# Patient Record
Sex: Female | Born: 1945 | Race: Black or African American | Hispanic: No | Smoking: Former smoker
Health system: Southern US, Community
[De-identification: ages and names within clinical notes are randomized; demographics above are authoritative.]

## PROBLEM LIST (undated history)

## (undated) DIAGNOSIS — E119 Type 2 diabetes mellitus without complications: Secondary | ICD-10-CM

## (undated) DIAGNOSIS — F0390 Unspecified dementia without behavioral disturbance: Secondary | ICD-10-CM

## (undated) DIAGNOSIS — J40 Bronchitis, not specified as acute or chronic: Secondary | ICD-10-CM

## (undated) DIAGNOSIS — G4730 Sleep apnea, unspecified: Secondary | ICD-10-CM

## (undated) DIAGNOSIS — Z951 Presence of aortocoronary bypass graft: Secondary | ICD-10-CM

## (undated) DIAGNOSIS — J449 Chronic obstructive pulmonary disease, unspecified: Secondary | ICD-10-CM

## (undated) HISTORY — PX: PACEMAKER INSERTION: SHX728

## (undated) HISTORY — PX: CORONARY ARTERY BYPASS GRAFT: SHX141

---

## 2015-07-31 ENCOUNTER — Emergency Department (HOSPITAL_COMMUNITY): Payer: Medicare Other

## 2015-07-31 ENCOUNTER — Observation Stay (HOSPITAL_COMMUNITY)
Admission: EM | Admit: 2015-07-31 | Discharge: 2015-08-01 | Disposition: A | Discharge status: Home / Self Care | Payer: Medicare Other | Attending: Internal Medicine | Admitting: Internal Medicine

## 2015-07-31 ENCOUNTER — Encounter (HOSPITAL_COMMUNITY): Payer: Self-pay | Admitting: Emergency Medicine

## 2015-07-31 DIAGNOSIS — R911 Solitary pulmonary nodule: Secondary | ICD-10-CM

## 2015-07-31 DIAGNOSIS — Z950 Presence of cardiac pacemaker: Secondary | ICD-10-CM | POA: Insufficient documentation

## 2015-07-31 DIAGNOSIS — Z794 Long term (current) use of insulin: Secondary | ICD-10-CM | POA: Diagnosis not present

## 2015-07-31 DIAGNOSIS — F05 Delirium due to known physiological condition: Secondary | ICD-10-CM | POA: Diagnosis not present

## 2015-07-31 DIAGNOSIS — R918 Other nonspecific abnormal finding of lung field: Secondary | ICD-10-CM

## 2015-07-31 DIAGNOSIS — N179 Acute kidney failure, unspecified: Secondary | ICD-10-CM | POA: Diagnosis not present

## 2015-07-31 DIAGNOSIS — J9621 Acute and chronic respiratory failure with hypoxia: Secondary | ICD-10-CM | POA: Diagnosis not present

## 2015-07-31 DIAGNOSIS — E119 Type 2 diabetes mellitus without complications: Secondary | ICD-10-CM

## 2015-07-31 DIAGNOSIS — G4730 Sleep apnea, unspecified: Secondary | ICD-10-CM | POA: Diagnosis not present

## 2015-07-31 DIAGNOSIS — Z79899 Other long term (current) drug therapy: Secondary | ICD-10-CM | POA: Diagnosis not present

## 2015-07-31 DIAGNOSIS — J189 Pneumonia, unspecified organism: Secondary | ICD-10-CM | POA: Diagnosis not present

## 2015-07-31 DIAGNOSIS — J441 Chronic obstructive pulmonary disease with (acute) exacerbation: Secondary | ICD-10-CM | POA: Diagnosis present

## 2015-07-31 DIAGNOSIS — Z951 Presence of aortocoronary bypass graft: Secondary | ICD-10-CM | POA: Diagnosis not present

## 2015-07-31 DIAGNOSIS — Z7982 Long term (current) use of aspirin: Secondary | ICD-10-CM | POA: Insufficient documentation

## 2015-07-31 DIAGNOSIS — F0390 Unspecified dementia without behavioral disturbance: Secondary | ICD-10-CM | POA: Diagnosis not present

## 2015-07-31 DIAGNOSIS — R05 Cough: Secondary | ICD-10-CM | POA: Diagnosis present

## 2015-07-31 DIAGNOSIS — Z87891 Personal history of nicotine dependence: Secondary | ICD-10-CM | POA: Insufficient documentation

## 2015-07-31 DIAGNOSIS — N289 Disorder of kidney and ureter, unspecified: Secondary | ICD-10-CM

## 2015-07-31 DIAGNOSIS — I25810 Atherosclerosis of coronary artery bypass graft(s) without angina pectoris: Secondary | ICD-10-CM | POA: Diagnosis not present

## 2015-07-31 HISTORY — DX: Unspecified dementia, unspecified severity, without behavioral disturbance, psychotic disturbance, mood disturbance, and anxiety: F03.90

## 2015-07-31 HISTORY — DX: Presence of aortocoronary bypass graft: Z95.1

## 2015-07-31 HISTORY — DX: Sleep apnea, unspecified: G47.30

## 2015-07-31 HISTORY — DX: Bronchitis, not specified as acute or chronic: J40

## 2015-07-31 HISTORY — DX: Chronic obstructive pulmonary disease, unspecified: J44.9

## 2015-07-31 HISTORY — DX: Type 2 diabetes mellitus without complications: E11.9

## 2015-07-31 LAB — CBC
HCT: 38.3 % (ref 36.0–46.0)
HEMOGLOBIN: 12.4 g/dL (ref 12.0–15.0)
MCH: 29.9 pg (ref 26.0–34.0)
MCHC: 32.4 g/dL (ref 30.0–36.0)
MCV: 92.3 fL (ref 78.0–100.0)
Platelets: 166 10*3/uL (ref 150–400)
RBC: 4.15 MIL/uL (ref 3.87–5.11)
RDW: 12.3 % (ref 11.5–15.5)
WBC: 5.6 10*3/uL (ref 4.0–10.5)

## 2015-07-31 LAB — BASIC METABOLIC PANEL
ANION GAP: 7 (ref 5–15)
BUN: 19 mg/dL (ref 6–20)
CHLORIDE: 108 mmol/L (ref 101–111)
CO2: 25 mmol/L (ref 22–32)
CREATININE: 1.62 mg/dL — AB (ref 0.44–1.00)
Calcium: 9.6 mg/dL (ref 8.9–10.3)
GFR calc non Af Amer: 31 mL/min — ABNORMAL LOW (ref >60)
GFR, EST AFRICAN AMERICAN: 36 mL/min — AB (ref >60)
Glucose, Bld: 90 mg/dL (ref 65–99)
Potassium: 4 mmol/L (ref 3.5–5.1)
Sodium: 140 mmol/L (ref 135–145)

## 2015-07-31 LAB — I-STAT TROPONIN, ED: Troponin i, poc: 0.01 ng/mL (ref 0.00–0.08)

## 2015-07-31 LAB — D-DIMER, QUANTITATIVE: D-Dimer, Quant: 0.7 ug/mL-FEU — ABNORMAL HIGH (ref 0.00–0.50)

## 2015-07-31 LAB — I-STAT CG4 LACTIC ACID, ED: LACTIC ACID, VENOUS: 0.83 mmol/L (ref 0.5–1.9)

## 2015-07-31 LAB — GLUCOSE, CAPILLARY: GLUCOSE-CAPILLARY: 119 mg/dL — AB (ref 65–99)

## 2015-07-31 MED ORDER — LEVOFLOXACIN 750 MG PO TABS: 750.0000 mg | ORAL_TABLET | ORAL | Status: DC

## 2015-07-31 MED ORDER — IPRATROPIUM-ALBUTEROL 0.5-2.5 (3) MG/3ML IN SOLN
3.0000 mL | RESPIRATORY_TRACT | Status: DC
  Administered 2015-07-31: 3 mL via RESPIRATORY_TRACT
  Filled 2015-07-31: qty 3

## 2015-07-31 MED ORDER — INSULIN GLARGINE 100 UNIT/ML ~~LOC~~ SOLN
10.0000 [IU] | Freq: Every day | SUBCUTANEOUS | Status: DC
  Administered 2015-07-31: 10 [IU] via SUBCUTANEOUS
  Filled 2015-07-31: qty 0.1

## 2015-07-31 MED ORDER — ROSUVASTATIN CALCIUM 40 MG PO TABS
40.0000 mg | ORAL_TABLET | Freq: Every day | ORAL | Status: DC
  Administered 2015-07-31: 40 mg via ORAL
  Filled 2015-07-31: qty 1

## 2015-07-31 MED ORDER — TRAZODONE HCL 100 MG PO TABS
300.0000 mg | ORAL_TABLET | Freq: Every day | ORAL | Status: DC
  Administered 2015-07-31: 300 mg via ORAL
  Filled 2015-07-31: qty 3

## 2015-07-31 MED ORDER — ENOXAPARIN SODIUM 30 MG/0.3ML ~~LOC~~ SOLN
30.0000 mg | SUBCUTANEOUS | Status: DC
  Administered 2015-07-31: 30 mg via SUBCUTANEOUS
  Filled 2015-07-31: qty 0.3

## 2015-07-31 MED ORDER — IPRATROPIUM-ALBUTEROL 0.5-2.5 (3) MG/3ML IN SOLN
3.0000 mL | Freq: Three times a day (TID) | RESPIRATORY_TRACT | Status: DC
  Administered 2015-08-01: 3 mL via RESPIRATORY_TRACT
  Filled 2015-07-31: qty 3

## 2015-07-31 MED ORDER — ALBUTEROL SULFATE (2.5 MG/3ML) 0.083% IN NEBU
5.0000 mg | INHALATION_SOLUTION | Freq: Once | RESPIRATORY_TRACT | Status: AC
  Administered 2015-07-31: 5 mg via RESPIRATORY_TRACT
  Filled 2015-07-31: qty 6

## 2015-07-31 MED ORDER — DONEPEZIL HCL 10 MG PO TABS
10.0000 mg | ORAL_TABLET | Freq: Every evening | ORAL | Status: DC
Start: 2015-07-31 — End: 2015-08-01
  Filled 2015-07-31: qty 1

## 2015-07-31 MED ORDER — GUAIFENESIN ER 600 MG PO TB12
600.0000 mg | ORAL_TABLET | Freq: Two times a day (BID) | ORAL | Status: DC
  Administered 2015-07-31 – 2015-08-01 (×2): 600 mg via ORAL
  Filled 2015-07-31 (×2): qty 1

## 2015-07-31 MED ORDER — SODIUM CHLORIDE 0.9 % IV SOLN
INTRAVENOUS | Status: DC
  Administered 2015-07-31: 21:00:00 via INTRAVENOUS

## 2015-07-31 MED ORDER — IOPAMIDOL (ISOVUE-370) INJECTION 76%
100.0000 mL | Freq: Once | INTRAVENOUS | Status: AC | PRN
  Administered 2015-07-31: 80 mL via INTRAVENOUS

## 2015-07-31 MED ORDER — QUETIAPINE FUMARATE 25 MG PO TABS
50.0000 mg | ORAL_TABLET | Freq: Every day | ORAL | Status: DC
  Administered 2015-08-01: 50 mg via ORAL
  Filled 2015-07-31 (×2): qty 2

## 2015-07-31 MED ORDER — ASPIRIN EC 81 MG PO TBEC
81.0000 mg | DELAYED_RELEASE_TABLET | Freq: Every day | ORAL | Status: DC
  Administered 2015-08-01: 81 mg via ORAL
  Filled 2015-07-31: qty 1

## 2015-07-31 MED ORDER — METHYLPREDNISOLONE SODIUM SUCC 125 MG IJ SOLR
60.0000 mg | Freq: Four times a day (QID) | INTRAMUSCULAR | Status: DC
Start: 2015-07-31 — End: 2015-08-01
  Administered 2015-07-31 – 2015-08-01 (×3): 60 mg via INTRAVENOUS
  Filled 2015-07-31 (×3): qty 2

## 2015-07-31 MED ORDER — ISOSORBIDE MONONITRATE ER 30 MG PO TB24
30.0000 mg | ORAL_TABLET | Freq: Every evening | ORAL | Status: DC
  Filled 2015-07-31: qty 1

## 2015-07-31 MED ORDER — CARVEDILOL 3.125 MG PO TABS
3.1250 mg | ORAL_TABLET | Freq: Two times a day (BID) | ORAL | Status: DC
  Administered 2015-08-01: 3.125 mg via ORAL
  Filled 2015-07-31: qty 1

## 2015-07-31 MED ORDER — INSULIN ASPART 100 UNIT/ML ~~LOC~~ SOLN
0.0000 [IU] | Freq: Three times a day (TID) | SUBCUTANEOUS | Status: DC
  Administered 2015-08-01: 2 [IU] via SUBCUTANEOUS

## 2015-07-31 MED ORDER — LEVOFLOXACIN IN D5W 750 MG/150ML IV SOLN
750.0000 mg | Freq: Once | INTRAVENOUS | Status: AC
  Administered 2015-07-31: 750 mg via INTRAVENOUS
  Filled 2015-07-31: qty 150

## 2015-07-31 MED ORDER — SODIUM CHLORIDE 0.9 % IV BOLUS (SEPSIS)
1000.0000 mL | Freq: Once | INTRAVENOUS | Status: AC
  Administered 2015-07-31: 1000 mL via INTRAVENOUS

## 2015-07-31 MED ORDER — ALBUTEROL SULFATE (2.5 MG/3ML) 0.083% IN NEBU
2.5000 mg | INHALATION_SOLUTION | Freq: Four times a day (QID) | RESPIRATORY_TRACT | Status: DC | PRN
  Administered 2015-08-01: 2.5 mg via RESPIRATORY_TRACT
  Filled 2015-07-31: qty 3

## 2015-07-31 MED ORDER — IPRATROPIUM-ALBUTEROL 0.5-2.5 (3) MG/3ML IN SOLN
3.0000 mL | Freq: Once | RESPIRATORY_TRACT | Status: AC
  Administered 2015-07-31: 3 mL via RESPIRATORY_TRACT
  Filled 2015-07-31: qty 3

## 2015-07-31 MED ORDER — LORATADINE 10 MG PO TABS: 10.0000 mg | ORAL_TABLET | Freq: Every day | ORAL | Status: DC

## 2015-07-31 MED ORDER — ENOXAPARIN SODIUM 40 MG/0.4ML ~~LOC~~ SOLN: 40.0000 mg | SUBCUTANEOUS | Status: DC

## 2015-07-31 NOTE — ED Triage Notes (Signed)
Patient states that she has been in [place] since June and family noticed cough but patient is unable to get up any phlegm but states that she can hear congestion in her chest when she coughs.  Patient states that she wears O2 Continuously at home and uses inhaler and neb treatment.  Patient came to Ed today without her O2.

## 2015-07-31 NOTE — ED Provider Notes (Signed)
[facility]-EMERGENCY DEPT Provider Note   CSN: [id number] Arrival date & time: 07/31/15  1328  First Provider Contact:  First MD Initiated Contact with Patient 07/31/15 1532     History   Chief Complaint Chief Complaint  Patient presents with  . Cough  . Shortness of Breath   HPI   Susan Shea is an 70 y.o. female with history of CAD, COPD, DM who presents to the ED for evaluation of shortness of breath. She states for the past three days she has felt more short of breath than usual. She states she does usually require 2L O2 via [place] at baseline and Saturday she was out for six hours and forgot to bring her O2 tank with her. Since then she has felt more short of breath than usual even with her home oxygen. She states she has had to work harder to breathe and has particularly noticed increased dyspnea on exertion. She has tried nebulizers at home with no relief. She does not think she is on chronic ICS. She denies chest pain. Denies new leg pain or swelling. She does note she recently traveled to the area from [place] to visit family. She endorses smoking cigarettes but quit "years ago." endorses cough increased from baseline. She states she has been taking all of her home meds as prescribed. Denies fever, chills, abd pain, n/v/d.   On arrival to the ED pt satting mid 80s on 2L [place] (her baseline oxygen requirement). No tachypnea.   Past Medical History:  Diagnosis Date  . Bronchitis   . COPD (chronic obstructive pulmonary disease) (HCC)   . Diabetes mellitus without complication (HCC)   . Hx of CABG     There are no active problems to display for this patient.   Past Surgical History:  Procedure Laterality Date  . CORONARY ARTERY BYPASS GRAFT    . PACEMAKER INSERTION      OB History    No data available       Home Medications    Prior to Admission medications   Medication Sig Start Date End Date Taking? Authorizing Provider  aspirin EC 81 MG tablet Take 81 mg by mouth  daily.   Yes Historical Provider, MD  carvedilol (COREG) 3.125 MG tablet Take 3.125 mg by mouth 2 (two) times daily with a meal.   Yes Historical Provider, MD  cetirizine (ZYRTEC) 10 MG tablet Take 10 mg by mouth at bedtime. 07/12/15  Yes Historical Provider, MD  donepezil (ARICEPT) 10 MG tablet Take 10 mg by mouth every evening.   Yes Historical Provider, MD  furosemide (LASIX) 20 MG tablet Take 20 mg by mouth every other day.   Yes Historical Provider, MD  insulin glargine (LANTUS) 100 UNIT/ML injection Inject 10 Units into the skin at bedtime.   Yes Historical Provider, MD  insulin lispro (HUMALOG) 100 UNIT/ML injection Inject 2-4 Units into the skin 3 (three) times daily before meals. If blood sugar is 149-180 inject 2 units,180-200 3 units, 200 and above 4 units   Yes Historical Provider, MD  isosorbide mononitrate (IMDUR) 30 MG 24 hr tablet Take 30 mg by mouth every evening.   Yes Historical Provider, MD  QUEtiapine (SEROQUEL) 100 MG tablet Take 50 mg by mouth daily.   Yes Historical Provider, MD  rosuvastatin (CRESTOR) 40 MG tablet Take 40 mg by mouth at bedtime.   Yes Historical Provider, MD  traZODone (DESYREL) 100 MG tablet Take 300 mg by mouth at bedtime.   Yes Historical  Provider, MD  triamcinolone cream (KENALOG) 0.1 % Apply 1 application topically 2 (two) times daily as needed (itch).  07/12/15  Yes Historical Provider, MD    Family History No family history on file.  Social History Social History  Substance Use Topics  . Smoking status: Former Games developer  . Smokeless tobacco: Never Used  . Alcohol use No    Allergies   Ampicillin and Penicillins   Review of Systems Review of Systems 10 Systems reviewed and are negative for acute change except as noted in the HPI.   Physical Exam Updated Vital Signs BP 109/66 (BP Location: Left Arm)   Pulse 76   Temp 98.4 F (36.9 C) (Oral)   Resp 19   Ht  (1.6 m)   SpO2 (!) 86%   Physical Exam  Constitutional: She is oriented  to person, place, and time. No distress.  HENT:  Right Ear: External ear normal.  Left Ear: External ear normal.  Nose: Nose normal.  Mouth/Throat: Oropharynx is clear and moist. No oropharyngeal exudate.  Eyes: Conjunctivae and EOM are normal. Pupils are equal, round, and reactive to light.  Neck: Normal range of motion. Neck supple.  Cardiovascular: Normal rate, regular rhythm and normal heart sounds.   Pulmonary/Chest: Effort normal. No respiratory distress. She exhibits no tenderness.  I evaluated pt after initial albuterol neb tx Bilateral rhonchi and coarse breath sounds, some improvement with coughing Minimal bilateral expiratory wheezing in bases No increased WOB No tachypnea  Abdominal: Soft. Bowel sounds are normal. She exhibits no distension. There is no tenderness. There is no rebound and no guarding.  Musculoskeletal: She exhibits no edema.  No LE edema No calf tenderness  Lymphadenopathy:    She has no cervical adenopathy.  Neurological: She is alert and oriented to person, place, and time. No cranial nerve deficit.  Skin: Skin is warm and dry. She is not diaphoretic.  Psychiatric: She has a normal mood and affect.  Nursing note and vitals reviewed.  Vitals:   07/31/15 1548 07/31/15 1615 07/31/15 1642 07/31/15 1740  BP:  96/71 102/62 (!) 107/50  Pulse: 63 (!) 54 (!) 51 (!) 51  Resp: Temp:    97.6 F (36.4 C)  TempSrc:    Oral  SpO2: 96% 94% 94% 93%  Height:         ED Treatments / Results  Labs (all labs ordered are listed, but only abnormal results are displayed) Labs Reviewed  BASIC METABOLIC PANEL - Abnormal; Notable for the following:       Result Value   Creatinine, Ser 1.62 (*)    GFR calc non Af Amer 31 (*)    GFR calc Af Amer 36 (*)    All other components within normal limits  D-DIMER, QUANTITATIVE (NOT AT [facility]) - Abnormal; Notable for the following:    D-Dimer, Quant 0.70 (*)    All other components within normal limits  CBC    I-STAT TROPOININ, ED  I-STAT CG4 LACTIC ACID, ED    Radiology Dg Chest 2 View  Result Date: 07/31/2015 CLINICAL DATA:  Shortness of breath for 2 days with dry cough. EXAM: CHEST  2 VIEW COMPARISON:  None. FINDINGS: The heart size is normal. AICD is in place. Posterior medial right lower lobe airspace disease is present. The upper lung fields are clear. Emphysema is noted. The patient is status post median sternotomy for CABG. No atherosclerotic calcifications are present throughout a tortuous aorta. There is  some exaggeration of thoracic kyphosis. Vertebral body heights and alignment are maintained. IMPRESSION: 1. Posterior medial right lower lobe pneumonia. 2. Additional bibasilar atelectasis is present bilaterally. 3. Emphysema. 4. Atherosclerosis of the thoracic aorta. Electronically Signed   By: Marin Roberts M.D.   On: 07/31/2015 16:24  Ct Angio Chest Pe W/cm &/or Wo Cm  Result Date: 07/31/2015 CLINICAL DATA:  Cough and congestion since June. EXAM: CT ANGIOGRAPHY CHEST WITH CONTRAST TECHNIQUE: Multidetector CT imaging of the chest was performed using the standard protocol during bolus administration of intravenous contrast. Multiplanar CT image reconstructions and MIPs were obtained to evaluate the vascular anatomy. CONTRAST:  80 cc Isovue 370 COMPARISON:  Chest x-ray 07/31/2015 FINDINGS: Cardiovascular: The heart is normal in size for age. No pericardial effusion. There is moderate tortuosity, ectasia and calcification of the thoracic aorta. No focal aneurysm. Extensive coronary artery calcifications. Surgical changes from bypass surgery. Permanent right ventricular pacer wires/AICD. The pulmonary arterial tree is fairly well opacified. No filling defects to suggest pulmonary embolism. Mediastinum/Nodes: No mediastinal or hilar mass or adenopathy. Small scattered lymph nodes are noted. The esophagus is grossly normal. Lungs/Pleura: Advanced emphysematous changes and pulmonary scarring.  Right middle lobe airspace opacity and loss of volume likely chronic infiltrate and atelectasis. There are also bibasilar scarring changes and areas of probable subpleural atelectasis. The left apical lung lesion measuring 12 mm is worrisome for neoplasm. PET-CT suggested for further evaluation. Upper Abdomen: No significant upper abdominal findings. Reflux of contrast material down the IVC likely due to tricuspid regurgitation. Musculoskeletal: No significant bony findings. No breast masses, supraclavicular or axillary adenopathy. Review of the MIP images confirms the above findings. IMPRESSION: 1. No CT findings for acute pulmonary embolism. 2. Surgical changes from bypass surgery with dense 3 vessel coronary artery calcifications. 3. Moderate tortuosity, ectasia and calcification of the thoracic aorta. 4. 12 x 10 mm left apical lung lesion worrisome for neoplasm. Recommend PET-CT for further evaluation. 5. Advanced emphysematous changes. 6. Right middle lobe infiltrate and atelectasis. Electronically Signed   By: Rudie Meyer M.D.   On: 07/31/2015 17:42   Procedures Procedures (including critical care time)  ED ECG REPORT   Date: 07/31/2015  Rate: 55  Rhythm: normal sinus rhythm  QRS Axis: normal  Intervals: QT prolonged  ST/T Wave abnormalities: nonspecific ST changes  Conduction Disutrbances:none  Narrative Interpretation:   Old EKG Reviewed: none available  I have personally reviewed the EKG tracing and agree with the computerized printout as noted.   Medications Ordered in ED Medications  levofloxacin (LEVAQUIN) IVPB 750 mg (750 mg Intravenous New Bag/Given 07/31/15 1825)  albuterol (PROVENTIL) (2.5 MG/3ML) 0.083% nebulizer solution 5 mg (5 mg Nebulization Given 07/31/15 1417)  ipratropium-albuterol (DUONEB) 0.5-2.5 (3) MG/3ML nebulizer solution 3 mL (3 mLs Nebulization Given 07/31/15 1615)  iopamidol (ISOVUE-370) 76 % injection 100 mL (80 mLs Intravenous Contrast Given 07/31/15 1714)   sodium chloride 0.9 % bolus 1,000 mL (1,000 mLs Intravenous New Bag/Given 07/31/15 1824)     Initial Impression / Assessment and Plan / ED Course  I have reviewed the triage vital signs and the nursing notes.  Pertinent labs & imaging results that were available during my care of the patient were reviewed by me and considered in my medical decision making (see chart for details).  Pt is an 70 y.o. female with known COPD visiting from [place] here with two days of increased SOB from baseline. Her oxygen requirement is increased from baseline. She feels better after albuterol  neb x 1 and duoneb x1. She is still fluctuating down to the low 90s high 80s with her baseline 2L O2 via [place]. Improves to mid 90s on 3L [place]. EKG nonactue. Trop negative. D-dimer was ordered due to shortness of breath and worsening hypoxia with recent travel though no LE edema or calf tenderness and unfortunately returned at 0.7. RLL pneumonia visualized on CXR. This is confirmed on CT angio. CT angio negative for PE. Unfortunately does show a 10mm left apical lesion worrisome for neoplasm with recommendations for PET scan. Dr. Ethelda Chick discussed findings with pt and her family. We will admit to the hospitalist team for COPD exacerbation likely secondary to community acquired pneumonia. Will give dose of levaquin in the ED and start some fluids. Cr is 1.6 today, unsure of baseline.  Spoke with Dr. Montez Morita who will admit pt to medsurg. Appreciate assistance.  Final Clinical Impressions(s) / ED Diagnoses   Final diagnoses:  COPD exacerbation (HCC)  CAP (community acquired pneumonia)  Acute on chronic respiratory failure with hypoxia (HCC)  Renal insufficiency    New Prescriptions New Prescriptions   No medications on file     Derl Barrow 07/31/15 1936 Medical screening examination/treatment/procedure(s) were conducted as a shared visit with non-physician practitioner(s) and myself.  I personally  evaluated the patient during the encounter.   EKG Interpretation None        Doug Sou, MD 08/01/15 4098

## 2015-07-31 NOTE — H&P (Signed)
History and Physical    Susan Shea ZHY:[id number] DOB: 22-Apr-1945 DOA: 07/31/2015  PCP: The patient is visiting from the [place] area  Patient coming from: Home  Chief Complaint: Progressive cough with sputum production, DOE  HPI: Susan Shea is a 70 y.o. woman with a history of COPD on 2L [place] at baseline, CAD S/P 1v CABG, DM, and sleep apnea (noncompliant with CPAP at baseline) who presents to the ED accompanied by her daughter for evaluation of progressive shortness of breath at rest and DOE for the past several days.  She admits that she has had a cough for several weeks (since June), but she has started to produce thick, white phlegm.  She denies hemoptysis.  No known sick contacts.  She denies chest pain, pressure, or tightness ("except around my pacemaker").  Some light-headedness but dizziness or syncope.  No nausea and vomiting.  Appetite is normal.  No weight loss.  She has been using her rescue inhaler 6-7 times daily plus nebulizer treatments twice daily without relief.  Of note, her daughter reports that she was treated for pneumonia in March.  The patient has a recent diagnosis of dementia and has been "removed" from her home by her children.  She is now alternating living with each child for several months at a time.  She is primarily back and forth between her two children's homes in [place].  She has been in this area with her daughter since June, and this is the first time that she has spent this much time in [place] in many years.    ED Course: The patient was hypoxic with O2 sats in the mid 80's on 2L [place] (her baseline) upon arrival.  She improved rapidly with duoneb treatments in the ED.  Chest xray shows a RML pneumonia (daughter is not sure of the location of previous pneumonia).  Lactic acid level is normal.  She has received 2L of NS.  She has received IV levaquin.  It does not appear that blood cultures were drawn in the ED.    D-Dimer was mildly positive, so CTA of the  chest was checked to rule out PE.  No evidence of PE, but the patient has a 1cm left apical lung mass that will need follow-up.    She is now maintaining normal O2 sats on 2L.  Hospitalist asked to admit.    Review of Systems: As per HPI otherwise 10 point review of systems negative.    Past Medical History:  Diagnosis Date  . Bronchitis   . COPD (chronic obstructive pulmonary disease) (HCC)   . Dementia   . Diabetes mellitus without complication (HCC)   . Hx of CABG   . Sleep apnea    Noncompliant with CPAP    Past Surgical History:  Procedure Laterality Date  . CORONARY ARTERY BYPASS GRAFT    . PACEMAKER INSERTION       reports that she quit smoking about 2 years ago. Her smoking use included Cigarettes. She quit after 50.00 years of use. She has never used smokeless tobacco. She reports that she does not drink alcohol or use drugs.  She is a widow.  She has three adult children as noted above.    Allergies  Allergen Reactions  . Ampicillin Hives  . Penicillins Hives    Has patient had a PCN reaction causing immediate rash, facial/tongue/throat swelling, SOB or lightheadedness with hypotension: Yes Has patient had a PCN reaction causing severe rash involving mucus membranes or skin  necrosis: Yes Has patient had a PCN reaction that required hospitalization Yes Has patient had a PCN reaction occurring within the last 10 years: No If all of the above answers are "NO", then may proceed with Cephalosporin use.    FAMILY HISTORY: Not obtained.  Prior to Admission medications   Medication Sig Start Date End Date Taking? Authorizing Provider  aspirin EC 81 MG tablet Take 81 mg by mouth daily.   Yes Historical Provider, MD  carvedilol (COREG) 3.125 MG tablet Take 3.125 mg by mouth 2 (two) times daily with a meal.   Yes Historical Provider, MD  cetirizine (ZYRTEC) 10 MG tablet Take 10 mg by mouth at bedtime. 07/12/15  Yes Historical Provider, MD  donepezil (ARICEPT) 10 MG tablet  Take 10 mg by mouth every evening.   Yes Historical Provider, MD  furosemide (LASIX) 20 MG tablet Take 20 mg by mouth every other day.   Yes Historical Provider, MD  insulin glargine (LANTUS) 100 UNIT/ML injection Inject 10 Units into the skin at bedtime.   Yes Historical Provider, MD  insulin lispro (HUMALOG) 100 UNIT/ML injection Inject 2-4 Units into the skin 3 (three) times daily before meals. If blood sugar is 149-180 inject 2 units,180-200 3 units, 200 and above 4 units   Yes Historical Provider, MD  isosorbide mononitrate (IMDUR) 30 MG 24 hr tablet Take 30 mg by mouth every evening.   Yes Historical Provider, MD  QUEtiapine (SEROQUEL) 100 MG tablet Take 50 mg by mouth daily.   Yes Historical Provider, MD  rosuvastatin (CRESTOR) 40 MG tablet Take 40 mg by mouth at bedtime.   Yes Historical Provider, MD  traZODone (DESYREL) 100 MG tablet Take 300 mg by mouth at bedtime.   Yes Historical Provider, MD  triamcinolone cream (KENALOG) 0.1 % Apply 1 application topically 2 (two) times daily as needed (itch).  07/12/15  Yes Historical Provider, MD    Physical Exam: Vitals:   07/31/15 1740 07/31/15 1930 07/31/15 2007 07/31/15 2100  BP: (!) 107/50 128/78 136/66   Pulse: (!) 51 (!) 48 (!) 55   Resp: 17 19 20    Temp: 97.6 F (36.4 C)  98.5 F (36.9 C)   TempSrc: Oral  Oral   SpO2: 93% 99% 98%   Weight:    62.5 kg (137 lb 12.6 oz)  Height:    5\' 3"  (1.6 m)      Constitutional: NAD, calm, comfortable, NONtoxic appearing Vitals:   07/31/15 1740 07/31/15 1930 07/31/15 2007 07/31/15 2100  BP: (!) 107/50 128/78 136/66   Pulse: (!) 51 (!) 48 (!) 55   Resp: 17 19 20    Temp: 97.6 F (36.4 C)  98.5 F (36.9 C)   TempSrc: Oral  Oral   SpO2: 93% 99% 98%   Weight:    62.5 kg (137 lb 12.6 oz)  Height:    5\' 3"  (1.6 m)   Eyes: PERRL, lids and conjunctivae normal ENMT: Mucous membranes are moist. Posterior pharynx clear of any exudate or lesions. Neck: normal appearance, supple Respiratory: No  significant wheeze or ronchi for me at this time.  Normal respiratory effort. No accessory muscle use.  Cardiovascular: Normal rate, regular rhythm, no murmurs / rubs / gallops. No extremity edema. 2+ pedal pulses. GI: abdomen is soft and compressible.  No distention.  No tenderness.  No masses palpated.  Bowel sounds are present. Musculoskeletal:  No joint deformity in upper and lower extremities. Good ROM, no contractures. Normal muscle tone.  Skin: no  rashes, warm and dry Neurologic: No focal deficits. Psychiatric: Normal judgment and insight. Alert and oriented x 3. Normal mood.     Labs on Admission: I have personally reviewed following labs and imaging studies  CBC:  Recent Labs Lab 07/31/15 1445  WBC 5.6  HGB 12.4  HCT 38.3  MCV 92.3  PLT 166   Basic Metabolic Panel:  Recent Labs Lab 07/31/15 1445  NA 140  K 4.0  CL 108  CO2 25  GLUCOSE 90  BUN 19  CREATININE 1.62*  CALCIUM 9.6   Sepsis Labs:  Lactic acid 0.83  I-STAT troponin negative  Radiological Exams on Admission: Dg Chest 2 View  Result Date: 07/31/2015 CLINICAL DATA:  Shortness of breath for 2 days with dry cough. EXAM: CHEST  2 VIEW COMPARISON:  None. FINDINGS: The heart size is normal. AICD is in place. Posterior medial right lower lobe airspace disease is present. The upper lung fields are clear. Emphysema is noted. The patient is status post median sternotomy for CABG. No atherosclerotic calcifications are present throughout a tortuous aorta. There is some exaggeration of thoracic kyphosis. Vertebral body heights and alignment are maintained. IMPRESSION: 1. Posterior medial right lower lobe pneumonia. 2. Additional bibasilar atelectasis is present bilaterally. 3. Emphysema. 4. Atherosclerosis of the thoracic aorta. Electronically Signed   By: Marin Roberts M.D.   On: 07/31/2015 16:24  Ct Angio Chest Pe W/cm &/or Wo Cm  Result Date: 07/31/2015 CLINICAL DATA:  Cough and congestion since June.  EXAM: CT ANGIOGRAPHY CHEST WITH CONTRAST TECHNIQUE: Multidetector CT imaging of the chest was performed using the standard protocol during bolus administration of intravenous contrast. Multiplanar CT image reconstructions and MIPs were obtained to evaluate the vascular anatomy. CONTRAST:  80 cc Isovue 370 COMPARISON:  Chest x-ray 07/31/2015 FINDINGS: Cardiovascular: The heart is normal in size for age. No pericardial effusion. There is moderate tortuosity, ectasia and calcification of the thoracic aorta. No focal aneurysm. Extensive coronary artery calcifications. Surgical changes from bypass surgery. Permanent right ventricular pacer wires/AICD. The pulmonary arterial tree is fairly well opacified. No filling defects to suggest pulmonary embolism. Mediastinum/Nodes: No mediastinal or hilar mass or adenopathy. Small scattered lymph nodes are noted. The esophagus is grossly normal. Lungs/Pleura: Advanced emphysematous changes and pulmonary scarring. Right middle lobe airspace opacity and loss of volume likely chronic infiltrate and atelectasis. There are also bibasilar scarring changes and areas of probable subpleural atelectasis. The left apical lung lesion measuring 12 mm is worrisome for neoplasm. PET-CT suggested for further evaluation. Upper Abdomen: No significant upper abdominal findings. Reflux of contrast material down the IVC likely due to tricuspid regurgitation. Musculoskeletal: No significant bony findings. No breast masses, supraclavicular or axillary adenopathy. Review of the MIP images confirms the above findings. IMPRESSION: 1. No CT findings for acute pulmonary embolism. 2. Surgical changes from bypass surgery with dense 3 vessel coronary artery calcifications. 3. Moderate tortuosity, ectasia and calcification of the thoracic aorta. 4. 12 x 10 mm left apical lung lesion worrisome for neoplasm. Recommend PET-CT for further evaluation. 5. Advanced emphysematous changes. 6. Right middle lobe infiltrate  and atelectasis. Electronically Signed   By: Rudie Meyer M.D.   On: 07/31/2015 17:42   EKG: Independently reviewed. NSR.  No acute ST segment changes.  Assessment/Plan Principal Problem:   CAP (community acquired pneumonia) Active Problems:   COPD exacerbation (HCC)   Renal insufficiency   Acute on chronic respiratory failure with hypoxia (HCC)   Lung mass   Diabetes (HCC)  CAD (coronary artery disease) of artery bypass graft      COPD exacerbation secondary to RML pneumonia, CAP --IV levaquin --IV solumedrol 60mg  IV q6h, anticipate quick taper --Duonebs q4h while awake and PRN --Respiratory therapy, with Incentive spirometer and flutter valve use --Blood and sputum cultures --Urine legionella and streptococcal antigens  Lung mass --Will need outpatient follow-up.  Patient's daughter advised to discuss with her siblings in the [place] area and decide where the patient will be best served long-term.  Recommend initiating the work-up in the city where she will be long-term, for adequate follow-up.  If that is [place], then we can certainly assist with the needed referrals for appropriate diagnosis and treatment plans.  History of CAD --Continue baby aspirin, coreg, imdur, statin  Elevated creatinine on admission, baseline unclear, ?AKI --Hold lasix --hydrate --Repeat BMP in the AM  Dementia --Continue home doses of Aricept, seroquel, trazodone   DVT prophylaxis: Lovenox Code Status: FULL Family Communication: Daughter at bedside Disposition Plan: Expect she will go home at discharge Consults called: NONE Admission status: Observation, med surg   TIME SPENT: 55 minutes   Jerene Bears MD [facility] Pager [phone]  If 7PM-7AM, please contact night-coverage [email or web address] Password [facility]  07/31/2015, 9:12 PM

## 2015-07-31 NOTE — Progress Notes (Signed)
Pharmacy Antibiotic Note  Susan Shea is a 70 y.o. female admitted on 07/31/2015 with CAP and COPD exacerbation. Patient placed on Levofloxacin x 5 days per MD (ordered PO on admission). Pharmacy has been consulted for renally adjusting antibiotics.   Plan: Adjust Levofloxacin to 750mg  PO q48h to complete 5 days of therapy. Monitor QTc closely as patient is also on quetiapine and trazodone, which can also prolong QT interval. Note patient has borderline prolonged QT interval on admission. May need to consider using another agent if remains or becomes more prolonged.  Monitor renal function, cultures, clinical course.   Height: 5\' 3"  (160 cm) Weight: 137 lb 12.6 oz (62.5 kg) IBW/kg (Calculated) : 52.4  Temp (24hrs), Avg:98.2 F (36.8 C), Min:97.6 F (36.4 C), Max:98.5 F (36.9 C)   Recent Labs Lab 07/31/15 1445 07/31/15 1839  WBC 5.6  --   CREATININE 1.62*  --   LATICACIDVEN  --  0.83    Estimated Creatinine Clearance: 26.7 mL/min (by C-G formula based on SCr of 1.62 mg/dL).    Allergies  Allergen Reactions  . Ampicillin Hives  . Penicillins Hives    Has patient had a PCN reaction causing immediate rash, facial/tongue/throat swelling, SOB or lightheadedness with hypotension: Yes Has patient had a PCN reaction causing severe rash involving mucus membranes or skin necrosis: Yes Has patient had a PCN reaction that required hospitalization Yes Has patient had a PCN reaction occurring within the last 10 years: No If all of the above answers are "NO", then may proceed with Cephalosporin use.     Antimicrobials this admission: 7/24 >> Levofloxacin >> (7/28)  Dose adjustments this admission: 7/24: adjusted dosing from q24h to q48h for reduced renal function  Microbiology results: 7/24 BCx: sent (after antibiotics given in ED) 7/24 Sputum: sent  7/24 Strep pneumo urinary antigen: ordered 7/24 Legionella urinary antigen: ordered  Thank you for allowing pharmacy to be a part  of this patient's care.   Greer Pickerel, PharmD, BCPS Pager: [phone] 07/31/2015 9:36 PM

## 2015-07-31 NOTE — Progress Notes (Signed)
Patient listed as having Medicare insurance without a pcp.  EDCM spoke to patient at bedside.  Patient reports she lives in [place] and is here visiting.  Patient confirms she does not have a pcp.  [facility] provided patient with a list of pcps who accept Medicare insurance within a ten mile radius of patient's zip code [address].  Patient thankful for resources.  No further EDCM needs at this time.

## 2015-07-31 NOTE — ED Provider Notes (Signed)
Complains of cough with progressively worsening shortness of breath and cough for the past few weeks. Chest x-ray viewed by me   Doug Sou, MD 07/31/15 [phone]

## 2015-07-31 NOTE — ED Notes (Signed)
Unable to collect labs right now patient getting a breathing treatment

## 2015-08-01 ENCOUNTER — Ambulatory Visit: Payer: Medicare Other | Admitting: Sports Medicine

## 2015-08-01 ENCOUNTER — Telehealth: Payer: Self-pay | Admitting: Internal Medicine

## 2015-08-01 DIAGNOSIS — J189 Pneumonia, unspecified organism: Secondary | ICD-10-CM | POA: Diagnosis not present

## 2015-08-01 LAB — CBC
HEMATOCRIT: 34.6 % — AB (ref 36.0–46.0)
HEMOGLOBIN: 11.5 g/dL — AB (ref 12.0–15.0)
MCH: 29.8 pg (ref 26.0–34.0)
MCHC: 33.2 g/dL (ref 30.0–36.0)
MCV: 89.6 fL (ref 78.0–100.0)
Platelets: 156 10*3/uL (ref 150–400)
RBC: 3.86 MIL/uL — ABNORMAL LOW (ref 3.87–5.11)
RDW: 12.6 % (ref 11.5–15.5)
WBC: 4.3 10*3/uL (ref 4.0–10.5)

## 2015-08-01 LAB — BASIC METABOLIC PANEL
ANION GAP: 7 (ref 5–15)
BUN: 18 mg/dL (ref 6–20)
CALCIUM: 9.3 mg/dL (ref 8.9–10.3)
CHLORIDE: 112 mmol/L — AB (ref 101–111)
CO2: 21 mmol/L — AB (ref 22–32)
Creatinine, Ser: 1.09 mg/dL — ABNORMAL HIGH (ref 0.44–1.00)
GFR calc Af Amer: 58 mL/min — ABNORMAL LOW (ref >60)
GFR calc non Af Amer: 50 mL/min — ABNORMAL LOW (ref >60)
GLUCOSE: 123 mg/dL — AB (ref 65–99)
Potassium: 4.6 mmol/L (ref 3.5–5.1)
Sodium: 140 mmol/L (ref 135–145)

## 2015-08-01 LAB — GLUCOSE, CAPILLARY: GLUCOSE-CAPILLARY: 152 mg/dL — AB (ref 65–99)

## 2015-08-01 MED ORDER — METHYLPREDNISOLONE 4 MG PO TBPK: ORAL_TABLET | ORAL | 0 refills | Status: DC

## 2015-08-01 MED ORDER — LEVOFLOXACIN 750 MG PO TABS
750.0000 mg | ORAL_TABLET | ORAL | 0 refills | Status: DC
Start: 2015-08-02 — End: 2015-08-23

## 2015-08-01 MED ORDER — ENOXAPARIN SODIUM 40 MG/0.4ML ~~LOC~~ SOLN: 40.0000 mg | SUBCUTANEOUS | Status: DC

## 2015-08-01 NOTE — Discharge Summary (Addendum)
Susan Shea ZOX:[id number] DOB: Apr 13, 1945 DOA: 07/31/2015  PCP: No PCP Per Patient  Admit date: 07/31/2015  Discharge date: 08/01/2015  Admitted From: Home  Disposition:  Home   Recommendations for Outpatient Follow-up:   Follow up with PCP in 1-2 weeks  PCP Please obtain BMP/CBC, 2 view CXR in 1week,  (see Discharge instructions)   PCP Please follow up on the following pending results: None   Home Health: None   Equipment/Devices: None  Consultations: None Discharge Condition: Fair   CODE STATUS: Full  Diet Recommendation: Heart healthy low carbohydrate   Chief Complaint  Patient presents with  . Cough  . Shortness of Breath     Brief history of present illness from the day of admission and additional interim summary    Susan Shea is a 70 y.o. woman with a history of COPD on 2L [place] at baseline, CAD S/P 1v CABG, DM, and sleep apnea (noncompliant with CPAP at baseline) who presents to the ED accompanied by her daughter for evaluation of progressive shortness of breath at rest and DOE for the past several days.  She admits that she has had a cough for several weeks (since June), but she has started to produce thick, white phlegm.  She denies hemoptysis.  No known sick contacts.  She denies chest pain, pressure, or tightness ("except around my pacemaker").  Some light-headedness but dizziness or syncope.  No nausea and vomiting.  Appetite is normal.  No weight loss.  She has been using her rescue inhaler 6-7 times daily plus nebulizer treatments twice daily without relief.  Of note, her daughter reports that she was treated for pneumonia in March.  The patient has a recent diagnosis of dementia and has been "removed" from her home by her children.  She is now alternating living with each child for several  months at a time.  She is primarily back and forth between her two children's homes in [place].  She has been in this area with her daughter since June, and this is the first time that she has spent this much time in [place] in many years.    ED Course: The patient was hypoxic with O2 sats in the mid 80's on 2L [place] (her baseline) upon arrival.  She improved rapidly with duoneb treatments in the ED.  Chest xray shows a RML pneumonia (daughter is not sure of the location of previous pneumonia).  Lactic acid level is normal.  She has received 2L of NS.  She has received IV levaquin.  It does not appear that blood cultures were drawn in the ED.    D-Dimer was mildly positive, so CTA of the chest was checked to rule out PE.  No evidence of PE, but the patient has a 1cm left apical lung mass that will need follow-up.    She is now maintaining normal O2 sats on 2L.  Hospitalist asked to admit.   Hospital issues addressed     1. Cough along with mild shortness of breath due to  community-acquired right middle lobe pneumonia. Patient has history of smoking quit a few years ago, history of underlying COPD on 2 L nasal cannula oxygen. With one dose of IV Solu-Medrol and IV Levaquin she is back to her baseline, ambulated in the hallway without any difficulty, currently symptom free eager to go home. Will be placed on 3 more days of Levaquin along with Medrol Dosepak. I hardly hear any wheezes at all. We'll follow with PCP either in [place] where she lives locally and get a repeat 2 view chest x-ray in a week.  2. COPD. On 2 L nasal cannula oxygen at home. At baseline. Plan as in above above.  3. This admission diagnosed left lung mass on CT scan. Follow with PCP and pulmonary.  4. DM type II. Continue home regimen.  5. History of CAD status post CABG. Chest pain-free continue home regimen of aspirin, Coreg, Imdur and statin for secondary prevention.  6. ARF. Resolved after hydration. Resume home  medications which include A6, monitor BMP closely.  7. Dementia with intermittent delirium. Continue home regimen of Aricept, Seroquel and trazodone.   Discharge diagnosis     Principal Problem:   CAP (community acquired pneumonia) Active Problems:   COPD exacerbation (HCC)   Renal insufficiency   Acute on chronic respiratory failure with hypoxia (HCC)   Lung mass   Diabetes (HCC)   CAD (coronary artery disease) of artery bypass graft    Discharge instructions    Discharge Instructions    Discharge instructions    Complete by:  As directed   Follow with Primary MD in 7 days   Get CBC, CMP, 2 view Chest X ray checked  by Primary MD or SNF MD in 5-7 days ( we routinely change or add medications that can affect your baseline labs and fluid status, therefore we recommend that you get the mentioned basic workup next visit with your PCP, your PCP may decide not to get them or add new tests based on their clinical decision)   Activity: As tolerated with Full fall precautions use walker/cane & assistance as needed   Disposition Home     Diet:   Heart Healthy  Low carb For Heart failure patients - Check your Weight same time everyday, if you gain over 2 pounds, or you develop in leg swelling, experience more shortness of breath or chest pain, call your Primary MD immediately. Follow Cardiac Low Salt Diet and 1.5 lit/day fluid restriction.   On your next visit with your primary care physician please Get Medicines reviewed and adjusted.   Please request your Prim.MD to go over all Hospital Tests and Procedure/Radiological results at the follow up, please get all Hospital records sent to your Prim MD by signing hospital release before you go home.   If you experience worsening of your admission symptoms, develop shortness of breath, life threatening emergency, suicidal or homicidal thoughts you must seek medical attention immediately by calling 911 or calling your MD immediately  if  symptoms less severe.  You Must read complete instructions/literature along with all the possible adverse reactions/side effects for all the Medicines you take and that have been prescribed to you. Take any new Medicines after you have completely understood and accpet all the possible adverse reactions/side effects.   Do not drive, operate heavy machinery, perform activities at heights, swimming or participation in water activities or provide baby sitting services if your were admitted for syncope or siezures until you have seen by Primary  MD or a Neurologist and advised to do so again.  Do not drive when taking Pain medications.    Do not take more than prescribed Pain, Sleep and Anxiety Medications  Special Instructions: If you have smoked or chewed Tobacco  in the last 2 yrs please stop smoking, stop any regular Alcohol  and or any Recreational drug use.  Wear Seat belts while driving.   Please note  You were cared for by a hospitalist during your hospital stay. If you have any questions about your discharge medications or the care you received while you were in the hospital after you are discharged, you can call the unit and asked to speak with the hospitalist on call if the hospitalist that took care of you is not available. Once you are discharged, your primary care physician will handle any further medical issues. Please note that NO REFILLS for any discharge medications will be authorized once you are discharged, as it is imperative that you return to your primary care physician (or establish a relationship with a primary care physician if you do not have one) for your aftercare needs so that they can reassess your need for medications and monitor your lab values.   Increase activity slowly    Complete by:  As directed      Discharge Medications     Medication List    TAKE these medications   aspirin EC 81 MG tablet Take 81 mg by mouth daily.   carvedilol 3.125 MG  tablet Commonly known as:  COREG Take 3.125 mg by mouth 2 (two) times daily with a meal.   cetirizine 10 MG tablet Commonly known as:  ZYRTEC Take 10 mg by mouth at bedtime.   donepezil 10 MG tablet Commonly known as:  ARICEPT Take 10 mg by mouth every evening.   furosemide 20 MG tablet Commonly known as:  LASIX Take 20 mg by mouth every other day.   insulin glargine 100 UNIT/ML injection Commonly known as:  LANTUS Inject 10 Units into the skin at bedtime.   insulin lispro 100 UNIT/ML injection Commonly known as:  HUMALOG Inject 2-4 Units into the skin 3 (three) times daily before meals. If blood sugar is 149-180 inject 2 units,180-200 3 units, 200 and above 4 units   isosorbide mononitrate 30 MG 24 hr tablet Commonly known as:  IMDUR Take 30 mg by mouth every evening.   levofloxacin 750 MG tablet Commonly known as:  LEVAQUIN Take 1 tablet (750 mg total) by mouth every other day. Start taking on:  08/02/2015   methylPREDNISolone 4 MG Tbpk tablet Commonly known as:  MEDROL DOSEPAK follow package directions   QUEtiapine 100 MG tablet Commonly known as:  SEROQUEL Take 50 mg by mouth daily.   rosuvastatin 40 MG tablet Commonly known as:  CRESTOR Take 40 mg by mouth at bedtime.   traZODone 100 MG tablet Commonly known as:  DESYREL Take 300 mg by mouth at bedtime.   triamcinolone cream 0.1 % Commonly known as:  KENALOG Apply 1 application topically 2 (two) times daily as needed (itch).       Allergies  Allergen Reactions  . Ampicillin Hives  . Penicillins Hives    Has patient had a PCN reaction causing immediate rash, facial/tongue/throat swelling, SOB or lightheadedness with hypotension: Yes Has patient had a PCN reaction causing severe rash involving mucus membranes or skin necrosis: Yes Has patient had a PCN reaction that required hospitalization Yes Has patient had a PCN reaction  occurring within the last 10 years: No If all of the above answers are "NO",  then may proceed with Cephalosporin use.     Follow-up Information    RAMASWAMY,MURALI, MD. Schedule an appointment as soon as possible for a visit in 1 week(s).   Specialty:  Pulmonary Disease Why:  Lung Mass Contact information: [address] [place] [address] [phone]        [facility]. Schedule an appointment as soon as possible for a visit in 5 day(s).   Contact information: [address] [place] [address] [phone]          Major procedures and Radiology Reports - PLEASE review detailed and final reports thoroughly  -         Dg Chest 2 View  Result Date: 07/31/2015 CLINICAL DATA:  Shortness of breath for 2 days with dry cough. EXAM: CHEST  2 VIEW COMPARISON:  None. FINDINGS: The heart size is normal. AICD is in place. Posterior medial right lower lobe airspace disease is present. The upper lung fields are clear. Emphysema is noted. The patient is status post median sternotomy for CABG. No atherosclerotic calcifications are present throughout a tortuous aorta. There is some exaggeration of thoracic kyphosis. Vertebral body heights and alignment are maintained. IMPRESSION: 1. Posterior medial right lower lobe pneumonia. 2. Additional bibasilar atelectasis is present bilaterally. 3. Emphysema. 4. Atherosclerosis of the thoracic aorta. Electronically Signed   By: Marin Roberts M.D.   On: 07/31/2015 16:24  Ct Angio Chest Pe W/cm &/or Wo Cm  Result Date: 07/31/2015 CLINICAL DATA:  Cough and congestion since June. EXAM: CT ANGIOGRAPHY CHEST WITH CONTRAST TECHNIQUE: Multidetector CT imaging of the chest was performed using the standard protocol during bolus administration of intravenous contrast. Multiplanar CT image reconstructions and MIPs were obtained to evaluate the vascular anatomy. CONTRAST:  80 cc Isovue 370 COMPARISON:  Chest x-ray 07/31/2015 FINDINGS: Cardiovascular: The heart is normal in size for  age. No pericardial effusion. There is moderate tortuosity, ectasia and calcification of the thoracic aorta. No focal aneurysm. Extensive coronary artery calcifications. Surgical changes from bypass surgery. Permanent right ventricular pacer wires/AICD. The pulmonary arterial tree is fairly well opacified. No filling defects to suggest pulmonary embolism. Mediastinum/Nodes: No mediastinal or hilar mass or adenopathy. Small scattered lymph nodes are noted. The esophagus is grossly normal. Lungs/Pleura: Advanced emphysematous changes and pulmonary scarring. Right middle lobe airspace opacity and loss of volume likely chronic infiltrate and atelectasis. There are also bibasilar scarring changes and areas of probable subpleural atelectasis. The left apical lung lesion measuring 12 mm is worrisome for neoplasm. PET-CT suggested for further evaluation. Upper Abdomen: No significant upper abdominal findings. Reflux of contrast material down the IVC likely due to tricuspid regurgitation. Musculoskeletal: No significant bony findings. No breast masses, supraclavicular or axillary adenopathy. Review of the MIP images confirms the above findings. IMPRESSION: 1. No CT findings for acute pulmonary embolism. 2. Surgical changes from bypass surgery with dense 3 vessel coronary artery calcifications. 3. Moderate tortuosity, ectasia and calcification of the thoracic aorta. 4. 12 x 10 mm left apical lung lesion worrisome for neoplasm. Recommend PET-CT for further evaluation. 5. Advanced emphysematous changes. 6. Right middle lobe infiltrate and atelectasis. Electronically Signed   By: Rudie Meyer M.D.   On: 07/31/2015 17:42   Micro Results    Recent Results (from the past 240 hour(s))  Culture, blood (routine x 2) Call MD if unable to obtain prior to  antibiotics being given     Status: None (Preliminary result)   Collection Time: 07/31/15  9:11 PM  Result Value Ref Range Status   Specimen Description BLOOD LEFT ARM  Final    Special Requests IN PEDIATRIC BOTTLE 2CC  Final   Culture PENDING  Incomplete   Report Status PENDING  Incomplete  Culture, blood (routine x 2) Call MD if unable to obtain prior to antibiotics being given     Status: None (Preliminary result)   Collection Time: 07/31/15  9:11 PM  Result Value Ref Range Status   Specimen Description BLOOD RIGHT HAND  Final   Special Requests IN PEDIATRIC BOTTLE 0.5CC  Final   Culture PENDING  Incomplete   Report Status PENDING  Incomplete    Today   Subjective    Gwenith Villatoro today has no headache,no chest abdominal pain,no new weakness tingling or numbness, feels much better wants to go home today.     Objective   Blood pressure (!) 104/59, pulse 87, temperature 97.9 F (36.6 C), temperature source Oral, resp. rate 19, height 5\' 3"  (1.6 m), weight 62.5 kg (137 lb 12.6 oz), SpO2  92.   Intake/Output Summary (Last 24 hours) at 08/01/15 1011 Last data filed at 08/01/15 0850  Gross per 24 hour  Intake              480 ml  Output                0 ml  Net              480 ml    Exam Awake Alert, Oriented x 3, No new F.N deficits, Normal affect [place].AT,PERRAL Supple Neck,No JVD, No cervical lymphadenopathy appriciated.  Symmetrical Chest wall movement, Good air movement bilaterally, CTAB RRR,No Gallops,Rubs or new Murmurs, No Parasternal Heave +ve B.Sounds, Abd Soft, Non tender, No organomegaly appriciated, No rebound -guarding or rigidity. No Cyanosis, Clubbing or edema, No new Rash or bruise   Data Review   CBC w Diff:  Lab Results  Component Value Date   WBC 4.3 08/01/2015   HGB 11.5 (L) 08/01/2015   HCT 34.6 (L) 08/01/2015   PLT 156 08/01/2015    CMP:  Lab Results  Component Value Date   NA 140 08/01/2015   K 4.6 08/01/2015   CL 112 (H) 08/01/2015   CO2 21 (L) 08/01/2015   BUN 18 08/01/2015   CREATININE 1.09 (H) 08/01/2015  .   Total Time in preparing paper work, data evaluation and todays exam - 35  minutes  Leroy Sea M.D on 08/01/2015 at 10:11 AM  [facility]   Office  [phone]

## 2015-08-01 NOTE — Discharge Instructions (Signed)
Follow with Primary MD in 7 days   Get CBC, CMP, 2 view Chest X ray checked  by Primary MD or SNF MD in 5-7 days ( we routinely change or add medications that can affect your baseline labs and fluid status, therefore we recommend that you get the mentioned basic workup next visit with your PCP, your PCP may decide not to get them or add new tests based on their clinical decision)   Activity: As tolerated with Full fall precautions use walker/cane & assistance as needed   Disposition Home     Diet:   Heart Healthy  Low carb  For Heart failure patients - Check your Weight same time everyday, if you gain over 2 pounds, or you develop in leg swelling, experience more shortness of breath or chest pain, call your Primary MD immediately. Follow Cardiac Low Salt Diet and 1.5 lit/day fluid restriction.   On your next visit with your primary care physician please Get Medicines reviewed and adjusted.   Please request your Prim.MD to go over all Hospital Tests and Procedure/Radiological results at the follow up, please get all Hospital records sent to your Prim MD by signing hospital release before you go home.   If you experience worsening of your admission symptoms, develop shortness of breath, life threatening emergency, suicidal or homicidal thoughts you must seek medical attention immediately by calling 911 or calling your MD immediately  if symptoms less severe.  You Must read complete instructions/literature along with all the possible adverse reactions/side effects for all the Medicines you take and that have been prescribed to you. Take any new Medicines after you have completely understood and accpet all the possible adverse reactions/side effects.   Do not drive, operate heavy machinery, perform activities at heights, swimming or participation in water activities or provide baby sitting services if your were admitted for syncope or siezures until you have seen by Primary MD or a Neurologist  and advised to do so again.  Do not drive when taking Pain medications.    Do not take more than prescribed Pain, Sleep and Anxiety Medications  Special Instructions: If you have smoked or chewed Tobacco  in the last 2 yrs please stop smoking, stop any regular Alcohol  and or any Recreational drug use.  Wear Seat belts while driving.   Please note  You were cared for by a hospitalist during your hospital stay. If you have any questions about your discharge medications or the care you received while you were in the hospital after you are discharged, you can call the unit and asked to speak with the hospitalist on call if the hospitalist that took care of you is not available. Once you are discharged, your primary care physician will handle any further medical issues. Please note that NO REFILLS for any discharge medications will be authorized once you are discharged, as it is imperative that you return to your primary care physician (or establish a relationship with a primary care physician if you do not have one) for your aftercare needs so that they can reassess your need for medications and monitor your lab values.

## 2015-08-01 NOTE — Telephone Encounter (Signed)
Called spoke with pt's daughter. Per hospital pt needs follow up ov with MR in 1 week. Scheduled ov with MR on 08/03/15. She voiced understanding and had no further questions. Nothing further needed.

## 2015-08-03 ENCOUNTER — Telehealth: Payer: Self-pay | Admitting: Pulmonary Disease

## 2015-08-03 ENCOUNTER — Ambulatory Visit (INDEPENDENT_AMBULATORY_CARE_PROVIDER_SITE_OTHER): Payer: Medicare Other | Admitting: Internal Medicine

## 2015-08-03 ENCOUNTER — Encounter: Payer: Self-pay | Admitting: Internal Medicine

## 2015-08-03 VITALS — BP 122/66 | HR 72 | Ht 63.0 in | Wt 135.0 lb

## 2015-08-03 DIAGNOSIS — R911 Solitary pulmonary nodule: Secondary | ICD-10-CM

## 2015-08-03 DIAGNOSIS — Z87891 Personal history of nicotine dependence: Secondary | ICD-10-CM

## 2015-08-03 DIAGNOSIS — J439 Emphysema, unspecified: Secondary | ICD-10-CM

## 2015-08-03 LAB — LEGIONELLA PNEUMOPHILA SEROGP 1 UR AG: L. PNEUMOPHILA SEROGP 1 UR AG: NEGATIVE

## 2015-08-03 NOTE — Patient Instructions (Addendum)
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

## 2015-08-03 NOTE — Progress Notes (Signed)
Subjective:     Patient ID: Susan Shea, female   DOB: 1945/10/16, 70 y.o.   MRN: [id number] PCP Jonah Blue, NP  [address] [place] [address]   HPI  IOV 08/03/2015 Chief Complaint  Patient presents with  . Hospitalization Follow-up    pt recently hospitalized for CAP, COPD exacerbation.  Pt c/o prod cough with white mucus.    Susan Shea 70 y.o. is a African-American female. She is accompanied by her daughter. Patient lives in [place] with her son. She is currently in [place], [place] for the summer but will have stay here  for the next few to several weeks due to social issues. At baseline she has a pulmonologist in [place]. She is given been given diagnosis of COPD with oxygen use for the last few years. According to the daughter patient uses oxygen all the time. Patient is on [place] but never uses it. Patient is on some albuterol as needed but we do not know how often she uses it. There is some dementia and she is a poor historian. The daughter does attest that every few to several months she gets COPD exacerbation and is on antibiotics and prednisone. 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 Per review of the chart she was given a hypoxemic at rest in the hospital on room air. However currently she is not hypoxemic. She is currently finishing antibiotic and prednisone taper and she is beginning to feel baseline. Walking desaturation test today she walk only one lap and stopped due to dizziness but did not desaturate. Patient did have a CT angiogram of the chest 07/31/2015 that confirmed the findings. It also ruled out pulmonary embolism  but that is a left upper lobe apical nodule 1 cm very suspicious for early stage lung cancer and is the main reason for the consult today   CT chest 07/31/15 - personally visualized  IMPRESSION: 1. No CT findings for acute pulmonary embolism. 2. Surgical changes from bypass surgery with dense 3 vessel coronary artery calcifications. 3. Moderate tortuosity, ectasia and calcification of the thoracic aorta. 4. 12 x 10 mm left apical lung lesion worrisome for neoplasm. Recommend PET-CT for further evaluation. 5. Advanced emphysematous changes. 6. Right middle lobe infiltrate and atelectasis. Electronically Signed   By: Rudie Meyer M.D.   On: 07/31/2015 17:42   Walking desaturation test 185 feet 3 laps on room air: She walk only one lap and then stopped due to dizziness. Did not desaturate below 94%   has a past medical history of Bronchitis; COPD (chronic obstructive pulmonary disease) (HCC); Dementia; Diabetes mellitus without complication (HCC); CABG; and Sleep apnea.   reports that she quit smoking about 2 years ago. Her smoking use included Cigarettes. She has a 25.00 pack-year smoking history. She has never used smokeless tobacco.  Past Surgical History:  Procedure Laterality Date  . CORONARY ARTERY BYPASS GRAFT    . PACEMAKER INSERTION      Allergies  Allergen Reactions  . Ampicillin Hives  . Penicillins Hives    Has patient had a PCN reaction causing immediate rash, facial/tongue/throat swelling, SOB or lightheadedness with hypotension: Yes Has patient had a PCN reaction causing severe rash involving  mucus membranes or skin necrosis: Yes Has patient had a PCN reaction that required hospitalization Yes Has patient had a PCN reaction occurring within the last 10 years: No If all of the above answers are "NO", then may proceed with Cephalosporin use.      There is no immunization history on file for this patient.  No family history on file.   Current Outpatient  Prescriptions:  .  aspirin EC 81 MG tablet, Take 81 mg by mouth daily., Disp: , Rfl:  .  carvedilol (COREG) 3.125 MG tablet, Take 3.125 mg by mouth 2 (two) times daily with a meal., Disp: , Rfl:  .  cetirizine (ZYRTEC) 10 MG tablet, Take 10 mg by mouth at bedtime., Disp: , Rfl: 0 .  donepezil (ARICEPT) 10 MG tablet, Take 10 mg by mouth every evening., Disp: , Rfl:  .  furosemide (LASIX) 20 MG tablet, Take 20 mg by mouth every other day., Disp: , Rfl:  .  insulin glargine (LANTUS) 100 UNIT/ML injection, Inject 10 Units into the skin at bedtime., Disp: , Rfl:  .  insulin lispro (HUMALOG) 100 UNIT/ML injection, Inject 2-4 Units into the skin 3 (three) times daily before meals. If blood sugar is 149-180 inject 2 units,180-200 3 units, 200 and above 4 units, Disp: , Rfl:  .  isosorbide mononitrate (IMDUR) 30 MG 24 hr tablet, Take 30 mg by mouth every evening., Disp: , Rfl:  .  levofloxacin (LEVAQUIN) 750 MG tablet, Take 1 tablet (750 mg total) by mouth every other day., Disp: 3 tablet, Rfl: 0 .  methylPREDNISolone (MEDROL DOSEPAK) 4 MG TBPK tablet, follow package directions, Disp: 21 tablet, Rfl: 0 .  QUEtiapine (SEROQUEL) 100 MG tablet, Take 50 mg by mouth daily., Disp: , Rfl:  .  rosuvastatin (CRESTOR) 40 MG tablet, Take 40 mg by mouth at bedtime., Disp: , Rfl:  .  traZODone (DESYREL) 100 MG tablet, Take 300 mg by mouth at bedtime., Disp: , Rfl:  .  triamcinolone cream (KENALOG) 0.1 %, Apply 1 application topically 2 (two) times daily as needed (itch). , Disp: , Rfl: 0   Review of Systems  Constitutional: Positive for appetite change and fatigue. Negative for activity change, chills, diaphoresis, fever and unexpected weight change.  HENT: Negative.   Eyes: Negative.   Respiratory: Positive for cough. Negative for apnea, choking, chest tightness, wheezing and stridor.   Cardiovascular: Negative.   Gastrointestinal: Negative.   Endocrine: Negative.   Genitourinary: Negative.   Musculoskeletal:  Negative.   Allergic/Immunologic: Negative.   Neurological: Negative.   Hematological: Negative.   Psychiatric/Behavioral: Negative.        Objective:   Physical Exam  Constitutional: She is oriented to person, place, and time. She appears well-developed and well-nourished. No distress.  Mildly deconditioned looking female  HENT:  Head: Normocephalic and atraumatic.  Right Ear: External ear normal.  Left Ear: External ear normal.  Mouth/Throat: Oropharynx is clear and moist. No oropharyngeal exudate.  Eyes: Conjunctivae and EOM are normal. Pupils are equal, round, and reactive to light. Right eye exhibits no discharge. Left eye exhibits no discharge. No scleral icterus.  Neck: Normal range of motion. Neck supple. No JVD present. No tracheal deviation present. No thyromegaly present.  Cardiovascular: Normal rate, regular rhythm, normal heart sounds and intact distal pulses.  Exam reveals no gallop and no friction rub.   No murmur heard. Pulmonary/Chest: Effort normal and breath sounds normal. No respiratory distress. She has no wheezes. She has no rales. She  exhibits no tenderness.  Clear to auscultation bilaterally  Abdominal: Soft. Bowel sounds are normal. She exhibits no distension and no mass. There is no tenderness. There is no rebound and no guarding.  Musculoskeletal: Normal range of motion. She exhibits no edema or tenderness.  Lymphadenopathy:    She has no cervical adenopathy.  Neurological: She is alert and oriented to person, place, and time. She has normal reflexes. No cranial nerve deficit. She exhibits normal muscle tone. Coordination normal.  Skin: Skin is warm and dry. No rash noted. She is not diaphoretic. No erythema. No pallor.  Psychiatric: She has a normal mood and affect. Her behavior is normal. Judgment and thought content normal.  Though has history of dementia is able to understand my questions and give simple answers  Vitals reviewed.  Vitals:   08/03/15  1159  BP: 122/66  Pulse: 72  SpO2: 94%  Weight: 135 lb (61.2 kg)  Height:  (1.6 m)        Assessment:       ICD-9-CM ICD-10-CM   1. Nodule of left lung 793.11 R91.1 NM PET Image Initial (PI) Skull Base To Thigh     Pulmonary function test  2. Stopped smoking with greater than 25 pack year history V15.82 Z87.891 NM PET Image Initial (PI) Skull Base To Thigh     Pulmonary function test  3. Pulmonary emphysema, unspecified emphysema type (HCC) 492.8 J43.9 NM PET Image Initial (PI) Skull Base To Thigh     Pulmonary function test   Patient and her daughter are interested in workup and possible diagnosis while she continues to remain in [place] for the next few to several weeks or maybe even longer.     Plan:     Nodule of left lung  - left upper lobe apex - July 2017 Stopped smoking with greater than 25 pack year history Extremities is high pretest probably ready for early stage non-small cell lung cancer -   - do PET scan and PFT - CMA will try to get super-D image of the CT from 07/31/15 - Diagnostic and therapeutic and  prognostic(s) to be discussed after PET scan and PFT   Pulmonary emphysema, unspecified emphysema type (HCC) - do full PFT - walk test on Room air 08/03/2015  - cntinue dulera 2 puff twice daily  - at followup will consider adding spiriva  Followup - PFT and PET next few to several days - return to see Dr Delton Coombes or Dr Marchelle Gearing or Dr Jamison Neighbor next week after completing PET and PFT (Dr Marchelle Gearing on vacation next week)   Dr. Kalman Shan, M.D., [facility].C.P Pulmonary and Critical Care Medicine Staff Physician [facility]  Pulmonary and Critical Care Pager: [phone], If no answer or between  15:00h - 7:00h: call [phone]  08/03/2015 12:33 PM

## 2015-08-03 NOTE — Telephone Encounter (Signed)
LMTCB for vanda This is MR pt.

## 2015-08-04 NOTE — Telephone Encounter (Signed)
Called [facility]. Susan Shea is not working today. No one in the department knows why Susan Shea called. Will need to call back next week.

## 2015-08-05 LAB — CULTURE, BLOOD (ROUTINE X 2)
Culture: NO GROWTH
Culture: NO GROWTH

## 2015-08-07 NOTE — Telephone Encounter (Signed)
Super-D disk has been picked up by radiology and has been placed in MR's look-at folder to review.  Forwarding to MR to make aware.

## 2015-08-07 NOTE — Telephone Encounter (Signed)
Spoke with Rebecka Apley at [facility] CT, states that Super-D disk requested is ready for pickup.

## 2015-08-09 ENCOUNTER — Ambulatory Visit (HOSPITAL_COMMUNITY)
Admission: RE | Admit: 2015-08-09 | Discharge: 2015-08-09 | Disposition: A | Discharge status: Home / Self Care | Payer: Medicare Other | Source: Ambulatory Visit | Attending: Internal Medicine | Admitting: Internal Medicine

## 2015-08-09 DIAGNOSIS — R911 Solitary pulmonary nodule: Secondary | ICD-10-CM

## 2015-08-09 DIAGNOSIS — Z87891 Personal history of nicotine dependence: Secondary | ICD-10-CM | POA: Diagnosis not present

## 2015-08-09 DIAGNOSIS — J439 Emphysema, unspecified: Secondary | ICD-10-CM | POA: Insufficient documentation

## 2015-08-09 LAB — PULMONARY FUNCTION TEST
FEF 25-75 PRE: 0.43 L/s
FEF 25-75 Post: 0.88 L/sec
FEF2575-%CHANGE-POST: 104 %
FEF2575-%PRED-PRE: 27 %
FEF2575-%Pred-Post: 55 %
FEV1-%Change-Post: 22 %
FEV1-%PRED-POST: 48 %
FEV1-%Pred-Pre: 39 %
FEV1-PRE: 0.68 L
FEV1-Post: 0.83 L
FEV1FVC-%CHANGE-POST: -9 %
FEV1FVC-%PRED-PRE: 89 %
FEV6-%CHANGE-POST: 35 %
FEV6-%PRED-POST: 62 %
FEV6-%Pred-Pre: 46 %
FEV6-Post: 1.34 L
FEV6-Pre: 0.98 L
FEV6FVC-%Pred-Post: 104 %
FEV6FVC-%Pred-Pre: 104 %
FVC-%CHANGE-POST: 35 %
FVC-%Pred-Post: 59 %
FVC-%Pred-Pre: 44 %
FVC-Post: 1.34 L
FVC-Pre: 0.98 L
POST FEV1/FVC RATIO: 62 %
PRE FEV1/FVC RATIO: 69 %
Post FEV6/FVC ratio: 100 %
Pre FEV6/FVC Ratio: 100 %
RV % PRED: 332 %
RV: 7.19 L
TLC % PRED: 175 %
TLC: 8.62 L

## 2015-08-09 MED ORDER — ALBUTEROL SULFATE (2.5 MG/3ML) 0.083% IN NEBU
2.5000 mg | INHALATION_SOLUTION | Freq: Once | RESPIRATORY_TRACT | Status: AC
  Administered 2015-08-09: 2.5 mg via RESPIRATORY_TRACT

## 2015-08-15 ENCOUNTER — Telehealth: Payer: Self-pay | Admitting: Internal Medicine

## 2015-08-15 DIAGNOSIS — R911 Solitary pulmonary nodule: Secondary | ICD-10-CM

## 2015-08-15 NOTE — Telephone Encounter (Signed)
Susan PaneElise  PFT show gold stage 3 severe copd. Noticed that PET scan is only 08/16/15.  I had only asked for Dr Beatris ShipNstor or Delton CoombesByrum to see patient because I was thinking she wopuld have PET scan last week when I was on vacation. Now that I am back is here room to work her in with me next week after PET scan  Thanks  M

## 2015-08-16 ENCOUNTER — Ambulatory Visit (HOSPITAL_COMMUNITY)
Admission: RE | Admit: 2015-08-16 | Discharge: 2015-08-16 | Disposition: A | Discharge status: Home / Self Care | Payer: Medicare Other | Source: Ambulatory Visit | Attending: Internal Medicine | Admitting: Internal Medicine

## 2015-08-16 DIAGNOSIS — Z951 Presence of aortocoronary bypass graft: Secondary | ICD-10-CM | POA: Insufficient documentation

## 2015-08-16 DIAGNOSIS — Z87891 Personal history of nicotine dependence: Secondary | ICD-10-CM | POA: Insufficient documentation

## 2015-08-16 DIAGNOSIS — J439 Emphysema, unspecified: Secondary | ICD-10-CM

## 2015-08-16 DIAGNOSIS — J432 Centrilobular emphysema: Secondary | ICD-10-CM | POA: Insufficient documentation

## 2015-08-16 DIAGNOSIS — R911 Solitary pulmonary nodule: Secondary | ICD-10-CM | POA: Diagnosis not present

## 2015-08-16 DIAGNOSIS — K5730 Diverticulosis of large intestine without perforation or abscess without bleeding: Secondary | ICD-10-CM | POA: Diagnosis not present

## 2015-08-16 DIAGNOSIS — I700 Atherosclerosis of aorta: Secondary | ICD-10-CM | POA: Diagnosis not present

## 2015-08-16 DIAGNOSIS — I2510 Atherosclerotic heart disease of native coronary artery without angina pectoris: Secondary | ICD-10-CM | POA: Diagnosis not present

## 2015-08-16 LAB — GLUCOSE, CAPILLARY: Glucose-Capillary: 85 mg/dL (ref 65–99)

## 2015-08-16 MED ORDER — FLUDEOXYGLUCOSE F - 18 (FDG) INJECTION
915.0000 | Freq: Once | INTRAVENOUS | Status: AC | PRN
  Administered 2015-08-16: 915 via INTRAVENOUS

## 2015-08-16 NOTE — Telephone Encounter (Signed)
MR - your first opening is on 09/18/2015. I have not called pt with results yet, will call with results when appt can be scheduled as well.   Please advise if to schedule with another provider or wait till 9/11. Thanks.

## 2015-08-17 ENCOUNTER — Other Ambulatory Visit: Payer: Self-pay | Admitting: Internal Medicine

## 2015-08-17 DIAGNOSIS — E039 Hypothyroidism, unspecified: Secondary | ICD-10-CM

## 2015-08-17 NOTE — Telephone Encounter (Signed)
I looked at nodule on PET. She is not a ebus candidate . She might be a ENB candidate - so [facility] and give him the super -d (I have copid him). Even with ENB can be tough I think. He can discuss with patient/family what to do. I called daughter  But grandaugther picked up and told me to call later  Dr. Kalman ShanMurali Clarissia Mckeen, M.D., [facility]C.P Pulmonary and Critical Care Medicine Staff Physician  System [place] Pulmonary and Critical Care Pager: [phone], If no answer or between  15:00h - 7:00h: call [phone]  08/17/2015 3:03 PM

## 2015-08-17 NOTE — Telephone Encounter (Signed)
Morrie Sheldonshley put the super D disc in your look-at folder. Please advise if this is in your possession. Thanks.

## 2015-08-17 NOTE — Telephone Encounter (Signed)
See my other note. Fix patient wth Byrum for poissible ENG. Give him Super D  .Dr. Kalman ShanMurali Daizha Anand, M.D., [facility].C.P Pulmonary and Critical Care Medicine Staff Physician [facility] Pulmonary and Critical Care Pager: [phone], If no answer or between  15:00h - 7:00h: call [phone]  08/17/2015 3:04 PM

## 2015-08-18 NOTE — Telephone Encounter (Signed)
Robynn Panelise if you have this disk can you please give this to Lindsay/RB?  Thanks!

## 2015-08-18 NOTE — Telephone Encounter (Signed)
Gave super d to elis yesterday pm

## 2015-08-18 NOTE — Telephone Encounter (Signed)
Super D disc placed on RB's desk on top of laptop as directed per [place]. Nothing further needed at this time.

## 2015-08-18 NOTE — Telephone Encounter (Signed)
Super D disc has been placed on top of RB's laptop in his office, as directed to do per [place].

## 2015-08-21 NOTE — Telephone Encounter (Signed)
enb scheduled 08/30/15@10 :00 2nd case that day is this ok?

## 2015-08-21 NOTE — Telephone Encounter (Signed)
Agree that the LUL nodule can be reached by ENB. The apical / medial location make it more difficult but should still be able to reach. Please schedule ENB, goal on Wed 08/30/15 as 2nd case. She has an OV with MR this week to go over any questions that she has. Thanks.

## 2015-08-22 ENCOUNTER — Ambulatory Visit: Payer: Medicare Other | Admitting: Pulmonary Disease

## 2015-08-22 NOTE — Telephone Encounter (Signed)
Yes this is OK. I will place on my calendar. Please let pt know and close phone note. Thanks

## 2015-08-23 ENCOUNTER — Encounter: Payer: Self-pay | Admitting: Internal Medicine

## 2015-08-23 ENCOUNTER — Telehealth: Payer: Self-pay | Admitting: Internal Medicine

## 2015-08-23 ENCOUNTER — Ambulatory Visit (INDEPENDENT_AMBULATORY_CARE_PROVIDER_SITE_OTHER): Payer: Medicare Other | Admitting: Internal Medicine

## 2015-08-23 VITALS — BP 128/62 | HR 56 | Ht 63.0 in | Wt 143.0 lb

## 2015-08-23 DIAGNOSIS — J449 Chronic obstructive pulmonary disease, unspecified: Secondary | ICD-10-CM | POA: Insufficient documentation

## 2015-08-23 DIAGNOSIS — R911 Solitary pulmonary nodule: Secondary | ICD-10-CM

## 2015-08-23 NOTE — Telephone Encounter (Signed)
Robynn PaneElise with cc to dr byrum   -1) [place] with Dr. Marvell FullerLouis Ruiz [address] [place], phone number [phone]. Please call his office and fax records. In addition please give him my cell phone number and I'm happy to do a sign off transition of care  2) please get CD-ROM of the super D from Dr. Levy Pupaobert Byrum and also [facility] the [facility] and along with my office note and then by certified mail. Poffice visit note to the patient ulmonologist after confirming the correct address and phone number   3)  you can give copy of my office note from today to the patient    Dr. Kalman ShanMurali Arieona Swaggerty, M.D., [facility].P Pulmonary and Critical Care Medicine Staff Physician [place] [facility] [place] Pulmonary and Critical Care Pager: [phone], If no answer or between  15:00h - 7:00h: call [phone]  08/23/2015 3:25 PM

## 2015-08-23 NOTE — Patient Instructions (Addendum)
COPD, severe (HCC) - On our testing 08/09/2015 you have severe COPD with lung function at 40% - Continue your COPD care in [place] as before  Nodule of left lung - notice 07/31/2015  - This is 1.2 cm in size in the left upper lobe on PET scan August 2017 - Based on age, smoking history, COPD history and PET scan characteristics high pretest probability that this is stage I non-small cell lung cancer - You're not a surgical candidate because of severe COPD - On the other hand I think local radiation therapy has a potential to cure this if this was stage I lung cancer  - However you need navigational bronchoscopy in an attempt to make diagnosis - Sometimes radiation doctors might treated on the presumption that this is lung cancer - Because you want further care of this problem in [place] recommend that you take a session of my note, actual CD-ROM of the CT scan and actual CD-ROM of the PET scan received [place] and given to Dr. Carmine SavoyLuis  Ruiz  - We will cancel scheduled navigational bronchoscopy with Dr. Levy Pupaobert Byrum on 08/30/2015  - I'm happy to place a call to his office and communications was in a sign off  Follow-up as needed

## 2015-08-23 NOTE — Progress Notes (Signed)
Subjective:     Patient ID: Susan Shea, female   DOB: 09/27/1945, 70 y.o.   MRN: [id number]  HPI PCP Jonah BlueWHITE,SONJA, NP  [address] [place] [id number]   HPI  IOV 08/03/2015 Chief Complaint  Patient presents with  . Hospitalization Follow-up    pt recently hospitalized for CAP, COPD exacerbation.  Pt c/o prod cough with white mucus.    Susan Criss AlvinePrince 70 y.o. is a African-American female. She is accompanied by her daughter. Patient lives in [place] with her son. [place] in [place] the [place] but will have stay here  for the next few to several weeks due to social issues. At baseline she has a pulmonologist in [place]. She is given been given diagnosis of COPD with oxygen use for the last few years. According to the daughter patient uses oxygen all the time. Patient is on [place] but never uses it. Patient is on some albuterol as needed but we do not know how often she uses it. There is some dementia and she is a poor historian. The daughter does attest that every few to several months she gets COPD exacerbation and is on antibiotics and prednisone. 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 Per review of the chart she was given a hypoxemic at rest in the hospital on room air. However currently she is not hypoxemic. She is currently finishing antibiotic and prednisone taper and she is beginning to feel baseline. Walking desaturation test today she walk only one lap and stopped due to dizziness but did not desaturate. Patient did have a CT angiogram of the chest 07/31/2015 that confirmed the findings. It also ruled out pulmonary  embolism but that is a left upper lobe apical nodule 1 cm very suspicious for early stage lung cancer and is the main reason for the consult today   CT chest 07/31/15 - personally visualized  IMPRESSION: 1. No CT findings for acute pulmonary embolism. 2. Surgical changes from bypass surgery with dense 3 vessel coronary artery calcifications. 3. Moderate tortuosity, ectasia and calcification of the thoracic aorta. 4. 12 x 10 mm left apical lung lesion worrisome for neoplasm. Recommend PET-CT for further evaluation. 5. Advanced emphysematous changes. 6. Right middle lobe infiltrate and atelectasis. Electronically Signed   By: Rudie MeyerP.  Gallerani M.D.   On: 07/31/2015 17:42   Walking desaturation test 185 feet 3 laps on room air: She walk only one lap and then stopped due to dizziness. Did not desaturate below 94%   OV 08/23/2015  Chief Complaint  Patient presents with  . Follow-up    Pt here after PET and PFT. pt states her breathing is doing well. Pt c/o prod cough with white mucus. Pt denies CP/tightness and f/c/s.     08/09/2015: Pulmonary function test post bronchodilator FEV1 0.83 L/40% which is improved from 0.68 L/39% and a ratio of 22. Total lung capacity is 8.62/175% and residual volume is 7.2/332%. DLCO could not be performed  08/16/2015: PET scan personally visualized and as follows  CHEST  The nodule of concern in the medial aspect of the left upper lobe near the apex measures 12 x 9 mm on today's study (image 10  of series 8) and is hypermetabolic (SUVmax = 4.3). No other suspicious appearing pulmonary nodules or masses are noted. Diffuse bronchial wall thickening with moderate centrilobular emphysema. No hypermetabolic mediastinal or hilar nodes. Heart size is borderline enlarged. There is no significant pericardial fluid, thickening or pericardial calcification. There is aortic atherosclerosis, as well as atherosclerosis of the great vessels of the mediastinum and  the coronary arteries, including calcified atherosclerotic plaque in the left main, left anterior descending, left circumflex and right coronary arteries. Status post median sternotomy for CABG, including LIMA to the LAD. Left-sided pacemaker/AICD device in place with lead tip terminating in the right ventricular apex.  ABDOMEN/PELVIS  No abnormal hypermetabolic activity within the liver, pancreas, adrenal glands, or spleen. No hypermetabolic lymph nodes in the abdomen or pelvis. Sub cm exophytic low-attenuation lesion in the posterior aspect of the interpolar region of the left kidney is not characterized on today's noncontrast CT examination, but demonstrates no hypermetabolism, likely a small cyst. Atherosclerotic calcifications are noted throughout the abdominal and pelvic vasculature, without definite aneurysm. Extensive colonic diverticulosis is noted, most evident in the sigmoid colon, without surrounding inflammatory changes to suggest an acute diverticulitis at this time. Normal appendix. Small ventral hernia in the epigastric region containing only omental fat. No significant volume of ascites. No pneumoperitoneum.  SKELETON  Patchy hypermetabolism throughout the facet joints in the lumbar spine corresponding to degenerative changes on the CT portion of the examination, most evident on the left side at L5-S1, presumably chronic and degenerative/inflammatory. No other aggressive appearing hypermetabolic lytic or blastic lesions are noted in the visualized axial or appendicular skeleton. Median sternotomy wires.  IMPRESSION: 1. 12 x 9 mm pulmonary nodule in the medial aspect of the left upper lobe is mildly hypermetabolic and concerning for primary bronchogenic neoplasm. No associated lymphadenopathy, and no definite evidence of distant metastatic disease in the neck, chest, abdomen or pelvis. 2. Aortic atherosclerosis, in addition to left main and 3  vessel coronary artery disease. Status post median sternotomy for CABG, including LIMA to the LAD. 3. Diffuse bronchial wall thickening with moderate centrilobular emphysema. 4. Colonic diverticulosis without evidence of acute diverticulitis at this time. 5. Additional incidental findings, as above.   Electronically Signed   By: Trudie Reed M.D.   On: 08/16/2015 13:03    has a past medical history of Bronchitis; COPD (chronic obstructive pulmonary disease) (HCC); Dementia; Diabetes mellitus without complication (HCC); CABG; and Sleep apnea.   reports that she quit smoking about 2 years ago. Her smoking use included Cigarettes. She has a 25.00 pack-year smoking history. She has never used smokeless tobacco.  Past Surgical History:  Procedure Laterality Date  . CORONARY ARTERY BYPASS GRAFT    . PACEMAKER INSERTION      Allergies  Allergen Reactions  . Ampicillin Hives  . Penicillins Hives    Has patient had a PCN reaction causing immediate rash, facial/tongue/throat swelling, SOB or lightheadedness with hypotension: Yes Has patient had a PCN reaction causing severe rash involving mucus membranes or skin necrosis: Yes Has patient had a PCN reaction that required hospitalization Yes Has patient had a PCN reaction occurring within the last 10 years: No If all of the above answers are "NO", then may proceed with Cephalosporin use.      There is no immunization history on file for this patient.  No family history on file.   Current Outpatient Prescriptions:  .  aspirin EC 81 MG tablet, Take 81 mg by mouth daily., Disp: ,  Rfl:  .  carvedilol (COREG) 3.125 MG tablet, Take 3.125 mg by mouth 2 (two) times daily with a meal., Disp: , Rfl:  .  donepezil (ARICEPT) 10 MG tablet, Take 10 mg by mouth every evening., Disp: , Rfl:  .  insulin glargine (LANTUS) 100 UNIT/ML injection, Inject 10 Units into the skin at bedtime., Disp: , Rfl:  .  insulin lispro (HUMALOG) 100 UNIT/ML  injection, Inject 2-4 Units into the skin 3 (three) times daily before meals. If blood sugar is 149-180 inject 2 units,180-200 3 units, 200 and above 4 units, Disp: , Rfl:  .  isosorbide mononitrate (IMDUR) 30 MG 24 hr tablet, Take 30 mg by mouth every evening., Disp: , Rfl:  .  mometasone-formoterol (DULERA) 100-5 MCG/ACT AERO, Inhale 2 puffs into the lungs 2 (two) times daily as needed for wheezing., Disp: , Rfl:  .  QUEtiapine (SEROQUEL) 100 MG tablet, Take 50 mg by mouth daily., Disp: , Rfl:  .  rosuvastatin (CRESTOR) 40 MG tablet, Take 40 mg by mouth at bedtime., Disp: , Rfl:  .  traZODone (DESYREL) 100 MG tablet, Take 300 mg by mouth at bedtime., Disp: , Rfl:  .  triamcinolone cream (KENALOG) 0.1 %, Apply 1 application topically 2 (two) times daily as needed (itch). , Disp: , Rfl: 0   Review of Systems     Objective:   Physical Exam Vitals:   08/23/15 1500  BP: 128/62  Pulse: (!) 56  SpO2: 91%  Weight: 143 lb (64.9 kg)  Height: 5\' 3"  (1.6 m)    Discussion only visit    Assessment:       ICD-9-CM ICD-10-CM   1. COPD, severe (HCC) 496 J44.9   2. Nodule of left lung 793.11 R91.1    The bigger discussion was had with the patient, her son Etta GrandchildMarkie over the phone and daughter at the office bedside    Plan:     COPD, severe (HCC) - On our testing 08/09/2015 you have severe COPD with lung function at 40% - Continue your COPD care in [place] as before  Nodule of left lung - notice 07/31/2015  - This is 1.2 cm in size in the left upper lobe on PET scan August 2017 - Based on age, smoking history, COPD history and PET scan characteristics high pretest probability that this is stage I non-small cell lung cancer - You're not a surgical candidate because of severe COPD - On the other hand I think local radiation therapy has a potential to cure this if this was stage I lung cancer  - However you need navigational bronchoscopy in an attempt to make diagnosis - Sometimes  radiation doctors might treated on the presumption that this is lung cancer - Because you want further care of this problem in [place] recommend that you take a session of my note, actual CD-ROM of the CT scan and actual CD-ROM of the PET scan received [place] and given to Dr. Carmine SavoyLuis  Ruiz  - We will cancel scheduled navigational bronchoscopy with Dr. Levy Pupaobert Byrum on 08/30/2015  - I'm happy to place a call to his office and communications was in a sign off  Follow-up as needed     > 50% of this > 25 min visit spent in face to face counseling or coordination of care   Dr. Kalman ShanMurali Kylo Gavin, M.D., [facility].P Pulmonary and Critical Care Medicine Staff Physician [facility] Pager: [phone], If no  answer or between  15:00h - 7:00h: call [phone]  08/23/2015 3:21 PM

## 2015-08-23 NOTE — Telephone Encounter (Signed)
Spoke to daughter, pt has an appt to see dr Marchelle Gearingramaswamy 08/23/15 and will discuss this with him then, dr Marchelle Gearingramaswamy is aware and will notify me as to what to do about this appt Tobe SosSally E Ottinger

## 2015-08-24 ENCOUNTER — Ambulatory Visit
Admission: RE | Admit: 2015-08-24 | Discharge: 2015-08-24 | Disposition: A | Discharge status: Home / Self Care | Payer: Medicare Other | Source: Ambulatory Visit | Attending: Internal Medicine | Admitting: Internal Medicine

## 2015-08-24 DIAGNOSIS — E039 Hypothyroidism, unspecified: Secondary | ICD-10-CM

## 2015-08-24 NOTE — Telephone Encounter (Signed)
I have not heard yet as to what I need to do about this appt after pt was seen yesterday do we need to cancel it or not?

## 2015-08-24 NOTE — Telephone Encounter (Signed)
Order cancelled with OR [place] back to [place]Susan SosSally E Ottinger

## 2015-08-24 NOTE — Telephone Encounter (Signed)
Yes, the ENB with Dr. Delton CoombesByrum needs to be cancelled as pt will be moving to [place].   [facility] and Dr. Delton CoombesByrum.

## 2015-08-24 NOTE — Telephone Encounter (Signed)
Called and spoke to [facility]athy. Informed her I would be faxing our records and sending via [employer] the pt's PET scan. Olegario MessierKathy verbalized understanding.    F:[phone] P: [phone] Address: [address] [place] [id number]   Dr. Fenton Mallinguiz will be in the office on 08/28/15 and can be reached at [phone], Dr. Jane Canaryamaswamy's cell number also [facility] to [facility] if Dr. Fenton Mallinguiz would rather call MR.

## 2015-08-24 NOTE — Telephone Encounter (Signed)
OK thank you 

## 2015-08-28 NOTE — Telephone Encounter (Signed)
CT disc has been given to me by RB. This disc will be given to [facility] per [facility]'s request.

## 2015-08-30 ENCOUNTER — Encounter (HOSPITAL_COMMUNITY): Admission: RE | Payer: Self-pay | Source: Ambulatory Visit

## 2015-08-30 ENCOUNTER — Ambulatory Visit (HOSPITAL_COMMUNITY): Admission: RE | Admit: 2015-08-30 | Payer: Medicare Other | Source: Ambulatory Visit | Admitting: Emergency Medicine

## 2015-08-30 SURGERY — VIDEO BRONCHOSCOPY WITH ENDOBRONCHIAL NAVIGATION
Anesthesia: General | Laterality: Left

## 2015-08-30 NOTE — Telephone Encounter (Signed)
Super [facility] disc received. [facility], message left for Avanda who is the person responsible for creating the discs. Will await call back.

## 2015-08-31 NOTE — Telephone Encounter (Signed)
Called NM and spoke to Wandalee Ferdinandina, Tina states she will be able to have the disc brought to office on 8.25.2017. Will await disc.

## 2015-09-01 ENCOUNTER — Telehealth: Payer: Self-pay | Admitting: Internal Medicine

## 2015-09-01 ENCOUNTER — Telehealth: Payer: Self-pay | Admitting: Emergency Medicine

## 2015-09-01 NOTE — Telephone Encounter (Signed)
Called and spoke to pt's daughter. Informed her that the records will be sent out on Monday 28th via [employer]. Pt's daughter verbalized understanding and denied any further questions or concerns at this time.

## 2015-09-01 NOTE — Telephone Encounter (Signed)
Called spoke with pt's daughter. She states that the disc was suppose to be mailed out 08/23/15. I explained to her that I discussed this with Robynn Panelise and the disc was received at our office today. She states that she will contact her brother and inform him that the disc has not been mailed out at this time. I spoke with Robynn Panelise and the disc will me mailed out Monday morning. She voiced understanding and had no further questions. Nothing further needed.

## 2015-09-01 NOTE — Telephone Encounter (Signed)
Received disc from [facility]. Will have records (CT disc, PET disc, OV notes, and PFT) send via [employer] on Monday 09/04/15 as Aurther [place]ry in the [place] has left for the day. Nothing further needed at this time.

## 2015-09-01 NOTE — Telephone Encounter (Signed)
LVM for pt to return can.

## 2016-07-19 ENCOUNTER — Telehealth: Payer: Self-pay | Admitting: Internal Medicine

## 2016-07-19 NOTE — Telephone Encounter (Signed)
Patient's daughter Susan EpleyLatitia Shea is returning phone call [phone].

## 2016-07-19 NOTE — Telephone Encounter (Signed)
Called and spoke with pts daughter and she stated that the pt will be going back to [place] the 2nd week of august and her daughter would like her to see MR and have a repeat CT done here.  She is aware that MR is out of the office until 7/22 and that we will check and see if we can get the pt in with MR.  She is aware we will call her back once we figure out MR schedule.

## 2016-07-19 NOTE — Telephone Encounter (Signed)
lmomtcb x1 

## 2016-07-29 NOTE — Telephone Encounter (Signed)
Patient's daughter Susan Shea is returning phone call [phone].

## 2016-07-29 NOTE — Telephone Encounter (Signed)
LM x 1 for appt PLEASE SCHEDULE WITH MR 07/31/16. THANKS.

## 2016-07-29 NOTE — Telephone Encounter (Signed)
I have called and lmomtcb for the pts daughter---MR has 2 open spots on wed and wanted to offer her one of those spots.  thanks

## 2016-07-30 NOTE — Telephone Encounter (Signed)
Get her in with NP atleast or me next week if there is slot or in august  Dr. Kalman ShanMurali Ernestyne Caldwell, M.D., [facility].P Pulmonary and Critical Care Medicine Staff Physician [facility] Pager: [phone], If no answer or between  15:00h - 7:00h: call [phone]  07/30/2016 8:36 AM

## 2016-07-30 NOTE — Telephone Encounter (Signed)
lmtcb x2 for pt's daughter, Hetty BlendLatitia.

## 2016-07-31 ENCOUNTER — Ambulatory Visit (INDEPENDENT_AMBULATORY_CARE_PROVIDER_SITE_OTHER): Payer: Medicare Other | Admitting: Podiatry

## 2016-07-31 ENCOUNTER — Encounter: Payer: Self-pay | Admitting: Podiatry

## 2016-07-31 VITALS — BP 118/73 | HR 67 | Resp 18

## 2016-07-31 DIAGNOSIS — E1142 Type 2 diabetes mellitus with diabetic polyneuropathy: Secondary | ICD-10-CM | POA: Diagnosis not present

## 2016-07-31 DIAGNOSIS — B351 Tinea unguium: Secondary | ICD-10-CM | POA: Diagnosis not present

## 2016-07-31 NOTE — Progress Notes (Signed)
   Subjective:    Patient ID: Susan Shea, female    DOB: 05/18/1945, 71 y.o.   MRN: [id number]  HPI This patient presents today with her daughter present treatment room complaining of thickened and elongated toenails which are uncomfortable walking wearing shoes and requests trimming of these toenails. Patient has a history of trimming of the toenails by CME who provides home health care for her. He said the nails of been thick and elongated for 4-6 months and does not recall any recent trimming of his toenails by herself for the home health care writer  Patient is diabetic with a history of ulceration Patient denies claudication or amputation Patient is a former smoker discontinued approximately 2 years prior   Review of Systems  Respiratory: Positive for shortness of breath.        When off of oxygen for long period  Hematological: Bruises/bleeds easily.  All other systems reviewed and are negative.      Objective:   Physical Exam  Orientated 3  Vascular: No calf edema or calf tenderness bilaterally DP and PT pulses 2/4 bilaterally Capillary reflex within normal limits bilaterally  Neurological: Sensation to 10 g monofilament wire intact 7/9 bilaterally Vibratory sensation nonreactive right reactive left Ankle reflexes reactive bilaterally  Dermatological: No open skin lesions bilaterally Dry skin with absent hair growth bilaterally The toenails elongated, discolored, deforms 6-10  Musculoskeletal: There is no restriction ankle, subtalar, midtarsal joints bilaterally Manual motor testing dorsi flexion, plantar flexion 5/5 bilaterally        Assessment & Plan:   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

## 2016-07-31 NOTE — Telephone Encounter (Signed)
lmtcb x2 for pt's daughter. 

## 2016-07-31 NOTE — Patient Instructions (Signed)

## 2016-08-01 NOTE — Telephone Encounter (Signed)
lmtcb x3 for pt's daughter. 

## 2016-08-02 NOTE — Telephone Encounter (Signed)
We have attempted to contact the pt's daughter several times with no success or call back. Per triage protocol, message will be closed.

## 2017-08-29 ENCOUNTER — Ambulatory Visit: Payer: Medicare Other | Admitting: Podiatry

## 2017-08-29 ENCOUNTER — Encounter

## 2018-03-14 IMAGING — CT CT ANGIO CHEST
2 of 6 series · 18 of 36 positions shown · IV contrast (isovue)
Comparison: Chest x-ray 07/31/2015

CLINICAL DATA: Cough and congestion since [REDACTED].

EXAM:
CT ANGIOGRAPHY CHEST WITH CONTRAST
TECHNIQUE: Multidetector CT imaging of the chest was performed using the
standard protocol during bolus administration of intravenous
contrast. Multiplanar CT image reconstructions and MIPs were
obtained to evaluate the vascular anatomy.
CONTRAST:  80 cc Isovue 370

[Series 5: coronal mpr · coronal · 0.54mm/px · 1 of 140 slices shown]
[im 70/140  mediastinal]
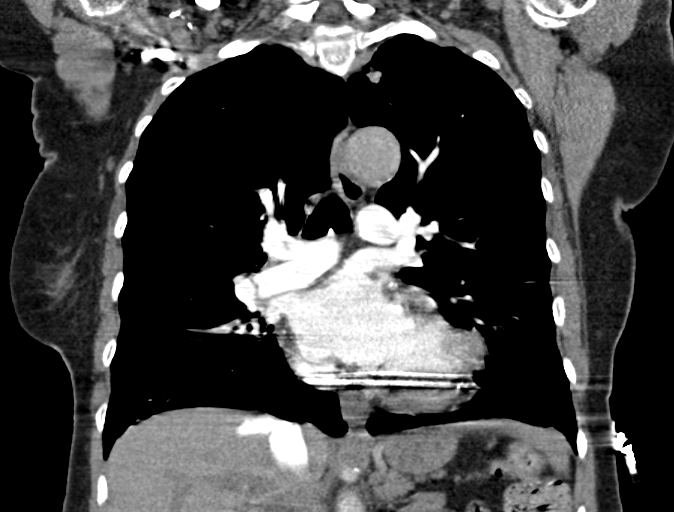

[Series 10: thins for pacs · axial · 0.74mm/px · z∈[-30,+221]mm · 17 of 279 slices shown]
[im 14/279  lung]
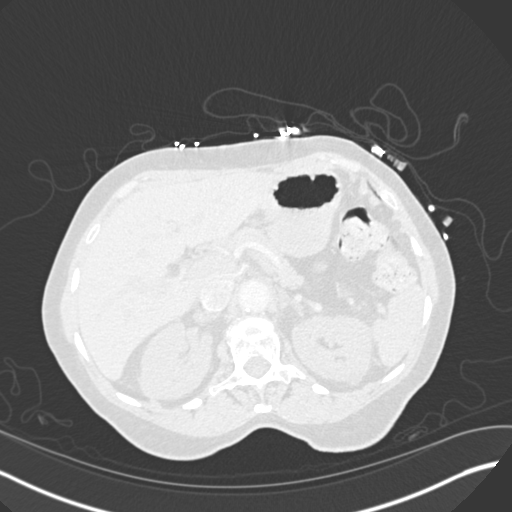
[im 28/279  mediastinal]
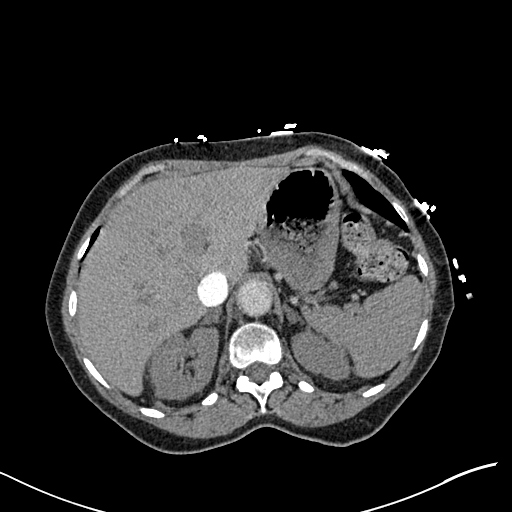
[im 42/279  lung]
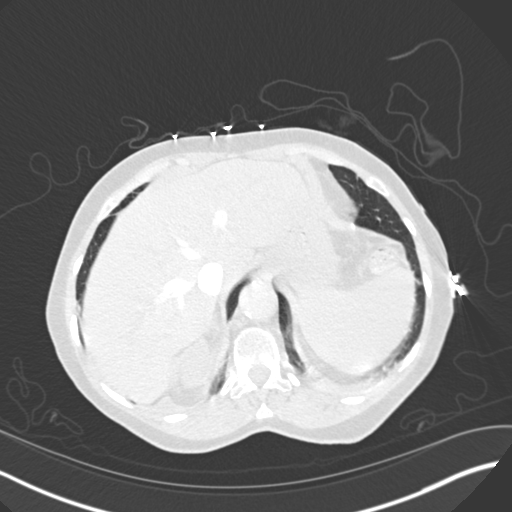
[im 56/279  mediastinal]
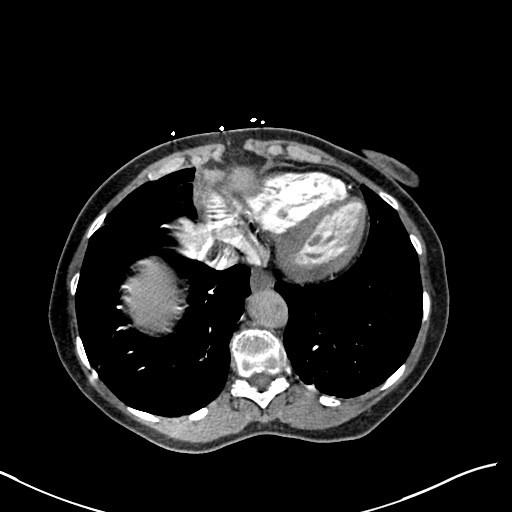
[im 84/279  lung]
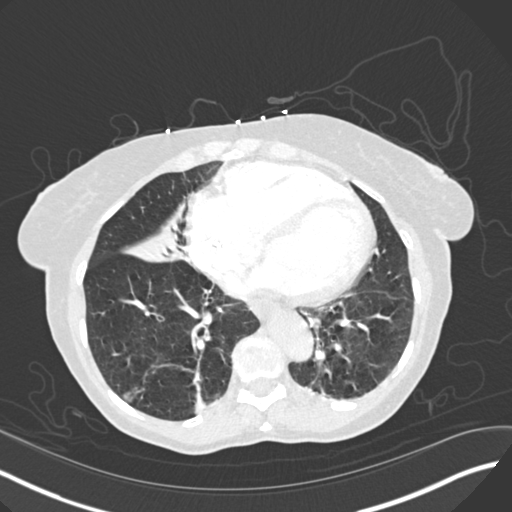
[im 98/279  mediastinal]
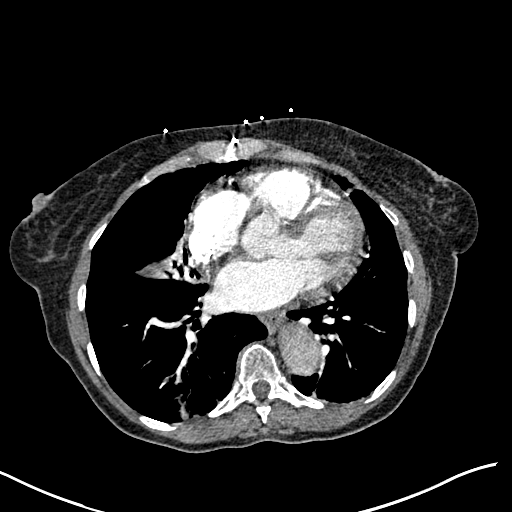
[im 112/279  lung]
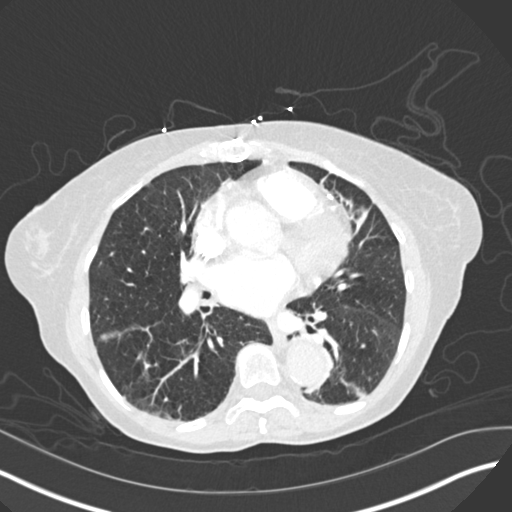
[im 126/279  mediastinal]
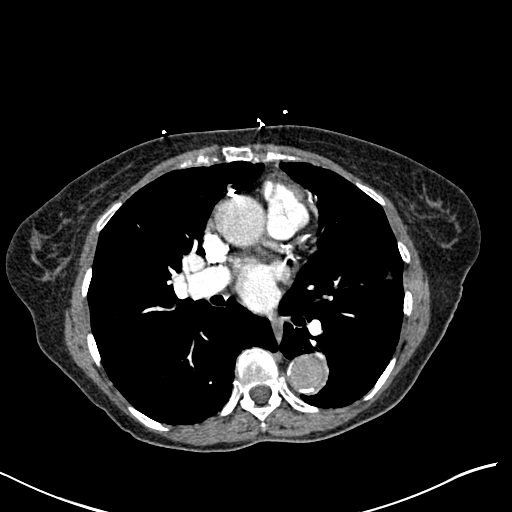
[im 140/279  lung]
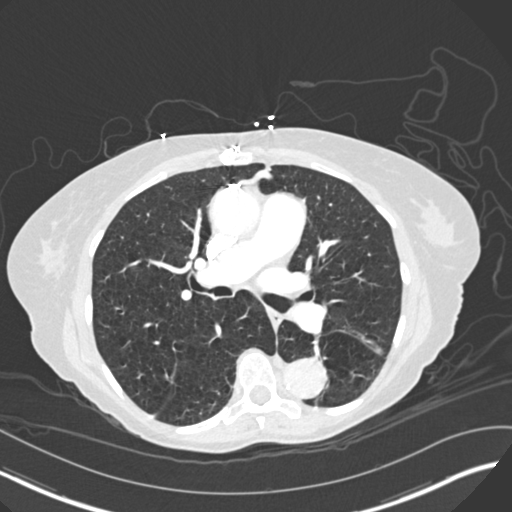
[im 153/279  mediastinal]
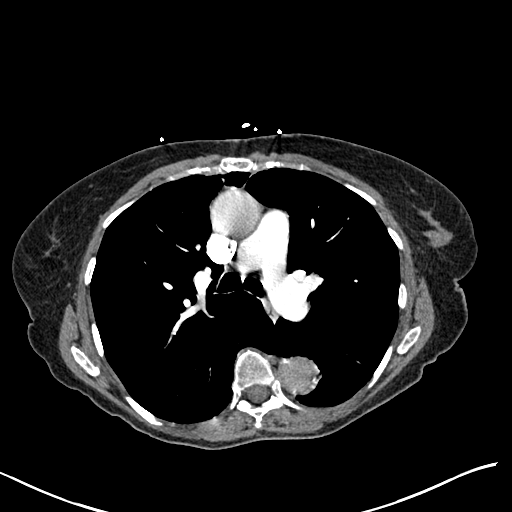
[im 167/279  lung]
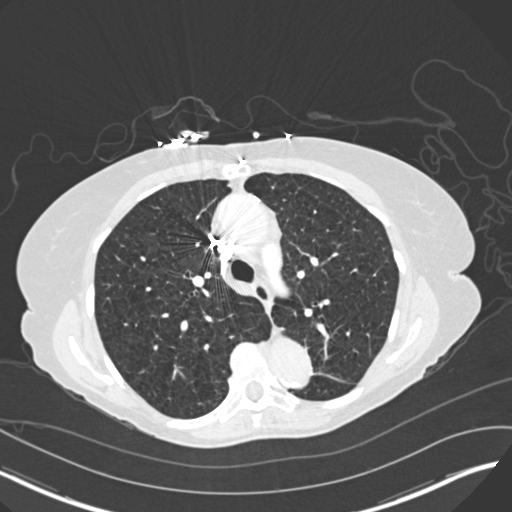
[im 181/279  mediastinal]
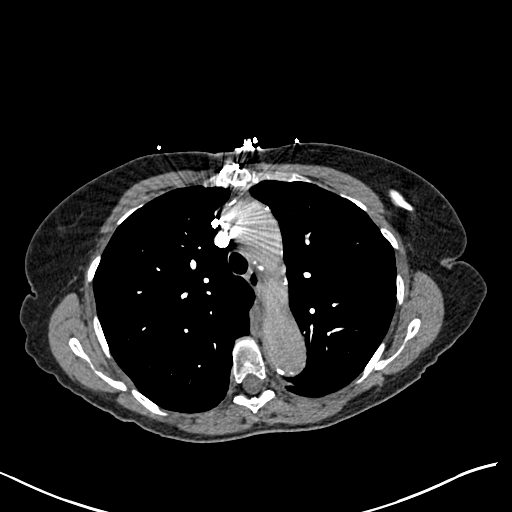
[im 195/279  lung]
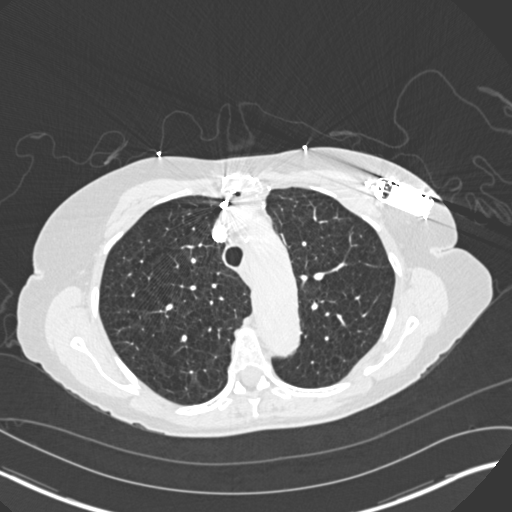
[im 223/279  mediastinal]
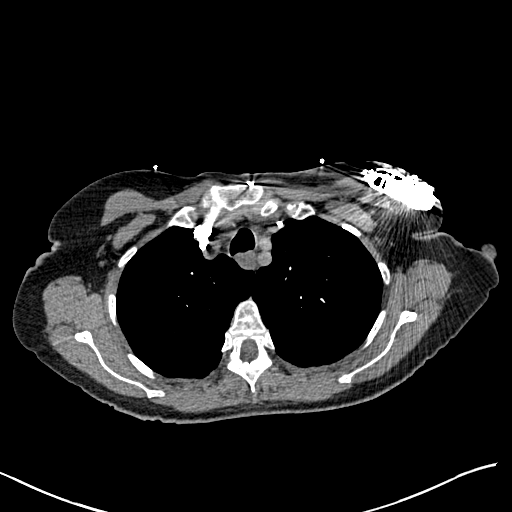
[im 237/279  lung]
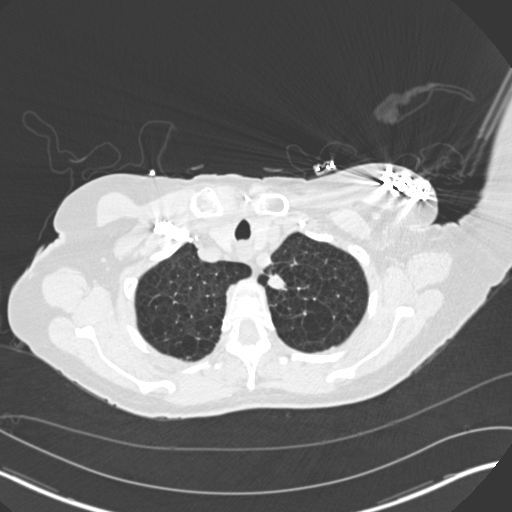
[im 251/279  mediastinal]
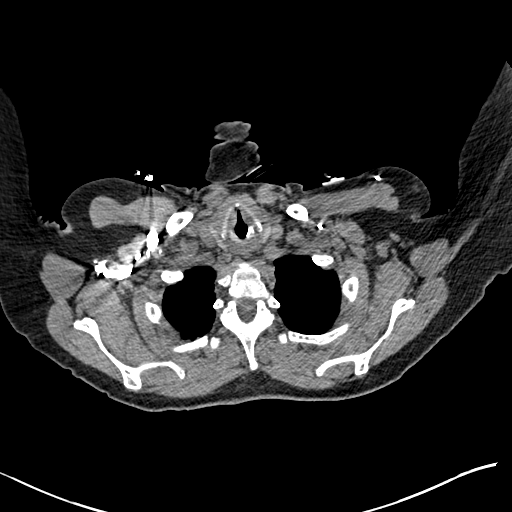
[im 265/279  lung]
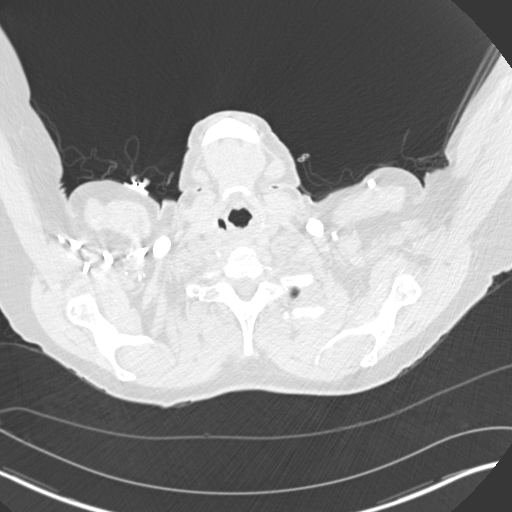

[18 of 36 positions shown; findings below may reference images not displayed]

FINDINGS: Cardiovascular: The heart is normal in size for age. No pericardial
effusion. There is moderate tortuosity, ectasia and calcification of
the thoracic aorta. No focal aneurysm. Extensive coronary artery
calcifications. Surgical changes from bypass surgery. Permanent
right ventricular pacer wires/AICD.

The pulmonary arterial tree is fairly well opacified. No filling
defects to suggest pulmonary embolism.

Mediastinum/Nodes: No mediastinal or hilar mass or adenopathy. Small
scattered lymph nodes are noted. The esophagus is grossly normal.

Lungs/Pleura: Advanced emphysematous changes and pulmonary scarring.
Right middle lobe airspace opacity and loss of volume likely chronic
infiltrate and atelectasis. There are also bibasilar scarring
changes and areas of probable subpleural atelectasis. The left
apical lung lesion measuring 12 mm is worrisome for neoplasm. PET-CT
suggested for further evaluation.

Upper Abdomen: No significant upper abdominal findings. Reflux of
contrast material down the IVC likely due to tricuspid
regurgitation.

Musculoskeletal: No significant bony findings.

No breast masses, supraclavicular or axillary adenopathy.

Review of the MIP images confirms the above findings.
IMPRESSION: 1. No CT findings for acute pulmonary embolism.
2. Surgical changes from bypass surgery with dense 3 vessel coronary
artery calcifications.
3. Moderate tortuosity, ectasia and calcification of the thoracic
aorta.
4. 12 x 10 mm left apical lung lesion worrisome for neoplasm.
Recommend PET-CT for further evaluation.
5. Advanced emphysematous changes.
6. Right middle lobe infiltrate and atelectasis.
